# Patient Record
Sex: Female | Born: 1950 | Race: White | Hispanic: No | Marital: Married | Smoking: Former smoker
Health system: Southern US, Community
[De-identification: ages and names within clinical notes are randomized; demographics above are authoritative.]

## PROBLEM LIST (undated history)

## (undated) DIAGNOSIS — E785 Hyperlipidemia, unspecified: Secondary | ICD-10-CM

## (undated) DIAGNOSIS — H269 Unspecified cataract: Secondary | ICD-10-CM

## (undated) DIAGNOSIS — C801 Malignant (primary) neoplasm, unspecified: Secondary | ICD-10-CM

## (undated) DIAGNOSIS — M8580 Other specified disorders of bone density and structure, unspecified site: Secondary | ICD-10-CM

## (undated) HISTORY — DX: Malignant (primary) neoplasm, unspecified: C80.1

## (undated) HISTORY — PX: EYE SURGERY: SHX253

## (undated) HISTORY — DX: Other specified disorders of bone density and structure, unspecified site: M85.80

## (undated) HISTORY — DX: Unspecified cataract: H26.9

## (undated) HISTORY — DX: Hyperlipidemia, unspecified: E78.5

---

## 1978-04-02 HISTORY — PX: APPENDECTOMY: SHX54

## 2002-04-02 LAB — HM COLONOSCOPY: HM Colonoscopy: NORMAL

## 2004-04-12 ENCOUNTER — Ambulatory Visit: Payer: Self-pay | Admitting: Internal Medicine

## 2004-12-14 ENCOUNTER — Ambulatory Visit: Payer: Self-pay | Admitting: Gastroenterology

## 2005-07-10 ENCOUNTER — Ambulatory Visit: Payer: Self-pay | Admitting: Internal Medicine

## 2006-08-22 ENCOUNTER — Ambulatory Visit: Payer: Self-pay | Admitting: Internal Medicine

## 2006-09-05 ENCOUNTER — Encounter: Payer: Self-pay | Admitting: Family Medicine

## 2007-06-26 ENCOUNTER — Ambulatory Visit: Payer: Self-pay | Admitting: Family Medicine

## 2007-06-26 DIAGNOSIS — E785 Hyperlipidemia, unspecified: Secondary | ICD-10-CM | POA: Insufficient documentation

## 2008-01-01 DIAGNOSIS — M8580 Other specified disorders of bone density and structure, unspecified site: Secondary | ICD-10-CM

## 2008-01-01 HISTORY — DX: Other specified disorders of bone density and structure, unspecified site: M85.80

## 2008-01-01 LAB — HM DEXA SCAN

## 2008-01-05 ENCOUNTER — Other Ambulatory Visit: Admission: RE | Admit: 2008-01-05 | Discharge: 2008-01-05 | Payer: Self-pay | Admitting: Family Medicine

## 2008-01-05 ENCOUNTER — Encounter: Payer: Self-pay | Admitting: Family Medicine

## 2008-01-05 ENCOUNTER — Ambulatory Visit: Payer: Self-pay | Admitting: Family Medicine

## 2008-01-07 ENCOUNTER — Encounter: Payer: Self-pay | Admitting: Family Medicine

## 2008-01-09 ENCOUNTER — Encounter: Payer: Self-pay | Admitting: Family Medicine

## 2008-01-15 ENCOUNTER — Encounter: Payer: Self-pay | Admitting: Family Medicine

## 2008-01-15 ENCOUNTER — Ambulatory Visit: Payer: Self-pay | Admitting: Family Medicine

## 2008-01-19 DIAGNOSIS — M8580 Other specified disorders of bone density and structure, unspecified site: Secondary | ICD-10-CM | POA: Insufficient documentation

## 2008-01-20 ENCOUNTER — Telehealth: Payer: Self-pay | Admitting: Family Medicine

## 2008-08-10 ENCOUNTER — Telehealth: Payer: Self-pay | Admitting: Family Medicine

## 2009-05-03 ENCOUNTER — Telehealth: Payer: Self-pay | Admitting: Family Medicine

## 2009-05-12 ENCOUNTER — Ambulatory Visit: Payer: Self-pay | Admitting: Family Medicine

## 2009-05-12 ENCOUNTER — Encounter: Payer: Self-pay | Admitting: Family Medicine

## 2009-05-18 ENCOUNTER — Encounter: Payer: Self-pay | Admitting: Family Medicine

## 2009-05-23 ENCOUNTER — Ambulatory Visit: Payer: Self-pay | Admitting: Family Medicine

## 2009-05-23 ENCOUNTER — Encounter: Payer: Self-pay | Admitting: Family Medicine

## 2009-05-26 ENCOUNTER — Encounter (INDEPENDENT_AMBULATORY_CARE_PROVIDER_SITE_OTHER): Payer: Self-pay | Admitting: *Deleted

## 2009-05-26 ENCOUNTER — Telehealth: Payer: Self-pay | Admitting: Family Medicine

## 2009-05-26 LAB — HM MAMMOGRAPHY: HM Mammogram: NORMAL

## 2009-05-27 ENCOUNTER — Encounter: Payer: Self-pay | Admitting: Family Medicine

## 2009-06-16 ENCOUNTER — Encounter: Payer: Self-pay | Admitting: Family Medicine

## 2009-06-22 ENCOUNTER — Telehealth: Payer: Self-pay | Admitting: Family Medicine

## 2009-06-29 ENCOUNTER — Ambulatory Visit: Payer: Self-pay | Admitting: Family Medicine

## 2009-08-25 ENCOUNTER — Telehealth: Payer: Self-pay | Admitting: Family Medicine

## 2009-09-07 ENCOUNTER — Telehealth: Payer: Self-pay | Admitting: Family Medicine

## 2009-11-21 ENCOUNTER — Encounter: Payer: Self-pay | Admitting: Family Medicine

## 2009-11-21 ENCOUNTER — Ambulatory Visit: Payer: Self-pay | Admitting: Family Medicine

## 2009-11-23 ENCOUNTER — Encounter (INDEPENDENT_AMBULATORY_CARE_PROVIDER_SITE_OTHER): Payer: Self-pay | Admitting: *Deleted

## 2009-11-25 ENCOUNTER — Encounter: Payer: Self-pay | Admitting: Family Medicine

## 2010-05-02 NOTE — Progress Notes (Signed)
Summary: schedule f/ u mammogram  Phone Note Call from Patient Call back at [phone]   Caller: Patient Summary of Call: Advised pt of f/u mammogram results.  She would like for [place] to go ahead and schedule her 6 month follow up in september. Please dont schedule on a Monday. Initial call taken by: Lowella Petties CMA,  May 26, 2009 12:30 PM  Follow-up for Phone Call        will do order and route to [facility] Follow-up by: Judith Part MD,  May 26, 2009 1:44 PM  Additional Follow-up for Phone Call Additional follow up Details #1::        6 month FU MmG appt made for 08//22/2011 at [facility] Order faxed and mailed to the patient. Additional Follow-up by: Carlton Adam,  May 30, 2009 11:17 AM

## 2010-05-02 NOTE — Progress Notes (Signed)
Summary: needs another order for labs  Phone Note Call from Patient   Caller: Patient Call For: Judith Part MD Summary of Call: Pt had order for labs, which she took to [facility], but they told her she was a hard stick and they couldnt get her blood.  She is asking for another order that she can take to another lab.  She didnt ask them for her order back.  Please mail to her home address when ready.  Order was for lipids, alt, ast, vitamin d level. Initial call taken by: Lowella Petties CMA,  September 07, 2009 4:16 PM  Follow-up for Phone Call        order done on px in IN box Follow-up by: Judith Part MD,  September 07, 2009 5:25 PM  Additional Follow-up for Phone Call Additional follow up Details #1::        Patient notified as instructed by telephone. Lab request mailed to home address as requested.Lewanda Rife LPN  September 07, 6293 5:36 PM

## 2010-05-02 NOTE — Miscellaneous (Signed)
Summary: mammogram results  Clinical Lists Changes  Observations: Added new observation of MAMMO DUE: 12/2009 (05/26/2009 12:31) Added new observation of MAMMOGRAM: normal (05/26/2009 12:31)      Preventive Care Screening  Mammogram:    Date:  05/26/2009    Next Due:  12/2009    Results:  normal

## 2010-05-02 NOTE — Letter (Signed)
Summary: Results Follow up Letter  [place] [facility]  [address] [place], [place] [address]   Phone: [phone]  Fax: [phone]    11/23/2009 MRN: [id number]  [facility] [address] [place], [place]  [address]  Dear Ms. Moeller,  The following are the results of your recent test(s):  Test         Result    Pap Smear:        Normal _____  Not Normal _____ Comments: ______________________________________________________ Cholesterol: LDL(Bad cholesterol):         Your goal is less than:         HDL (Good cholesterol):       Your goal is more than: Comments:  ______________________________________________________ Mammogram:        Normal __X___  Not Normal _____ Comments:Dr. Tower recommends getting back on track with your regular mammogram for BOTH breasts in February.  ___________________________________________________________________ Hemoccult:        Normal _____  Not normal _______ Comments:    _____________________________________________________________________ Other Tests:    We routinely do not discuss normal results over the telephone.  If you desire a copy of the results, or you have any questions about this information we can discuss them at your next office visit.   Sincerely,      Janee Morn, CMA(AAMA)for Dr. Roxy Manns

## 2010-05-02 NOTE — Progress Notes (Signed)
Summary: wants order for blood work  Phone Note Call from Patient Call back at [phone]   Caller: Patient Summary of Call: Patient has an appointment on March 30th and would like an order to get her labs done prior to app. She would like order faxed to [phone]). She would also like to set up app. for mammorgram prior to app. Please advise.  Initial call taken by: Melody Comas,  May 03, 2009 3:05 PM  Follow-up for Phone Call        please sched labs wellness/ lipid/vit D 272, v70.0, 733.0 will do mam ref and route to [place] as well Follow-up by: Judith Part MD,  May 03, 2009 4:51 PM  Additional Follow-up for Phone Call Additional follow up Details #1::        Lab orders faxed to [phone]. Additional Follow-up by: Linde Gillis CMA Duncan Dull),  May 03, 2009 5:00 PM    Additional Follow-up for Phone Call Additional follow up Details #2::    MmG order put in for [facility] on 05/12/2009 at 3:45pm. Carlton Adam  May 05, 2009 3:49 PM  Follow-up by: Carlton Adam,  May 05, 2009 3:49 PM

## 2010-05-02 NOTE — Progress Notes (Signed)
Summary: [facility] request add'l test  Phone Note From Other Clinic   Caller: [employer] For: Dr Milinda Antis Summary of Call: [facility] is drawing lipid,ast,alt on pt now and pt request Vitamin D level. Dr Milinda Antis said pt had Vit D level in 05/2009 and it was within normal limits so she does not need done at this time, but if pt is adoment about getting OK to add Vit D level 268.9.Lewanda Rife LPN  Aug 25, 2009 9:22 AM  Initial call taken by: Lewanda Rife LPN,  Aug 25, 2009 9:22 AM

## 2010-05-02 NOTE — Assessment & Plan Note (Signed)
Summary: CPX/CLE   Vital Signs:  Patient profile:   60 year old female Height:      59.75 inches Weight:      134.75 pounds BMI:     26.63 Temp:     98.4 degrees F oral Pulse rate:   60 / minute Pulse rhythm:   regular BP sitting:   120 / 68  (left arm) Cuff size:   regular  Vitals Entered By: Lewanda Rife LPN (June 29, 2009 2:51 PM) CC: complete physical with pap and breast exam LMP 2 yrs ago   History of Present Illness: here for health mt exam  is feeling good   wt is down 4 lb with bmi of 26-- happy with that   likes to walk for exercise    lipids high  trig 95/ HDL 70 and LDL 168  (from 841)  simvastatin - stopped it for a while just to see what would happen  diet -- is excellent and really worked on it  got px yesterday to re start    osteopenia dexa 10/09 D level is ok at 45- was taking vit D 5000 international units per day- this works for her   pap 10/09 no gyn symptoms -- no bleeding  minor hot flashes - they have calmed   2/11 nl mam -- recommended 6 mo to check density  self exam - no lumps or changes   colonosc 04 normal - no problems or family hx   Td 09   will be interested in shingles vaccine when she is 61      Allergies (verified): No Known Drug Allergies  Past History:  Past Medical History: Last updated: 01/19/2008 hyperlipidema  family hx of OP osteopenia 10/09  Past Surgical History: Last updated: 01/19/2008 appy in 1980 after rupture  2D echo stress test 10/09 dexa with osteopenia   Family History: Last updated: 01/05/2008 father high chol, htn mother healthy - OP  Social History: Last updated: 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 Eyes:  Denies blurring and eye irritation. CV:  Denies chest pain or discomfort, lightheadness, palpitations, and shortness of breath with  exertion. Resp:  Denies cough, shortness of breath, and wheezing. GI:  Denies abdominal pain, bloody stools, change in bowel habits, indigestion, and nausea. GU:  Denies abnormal vaginal bleeding, discharge, dysuria, and urinary frequency. MS:  Denies joint redness, loss of strength, muscle aches, and cramps. Derm:  Denies itching, lesion(s), poor wound healing, and rash. Neuro:  Denies numbness and tingling. Psych:  Denies anxiety and depression. Endo:  Denies cold intolerance, excessive thirst, excessive urination, and heat intolerance. Heme:  Denies abnormal bruising and bleeding.  Physical Exam  General:  Well-developed,well-nourished,in no acute distress; alert,appropriate and cooperative throughout examination Head:  normocephalic, atraumatic, and no abnormalities observed.   Eyes:  vision grossly intact, pupils equal, pupils round, and pupils reactive to light.  no conjunctival pallor, injection or icterus  Ears:  R ear normal and L ear normal.   Nose:  no nasal discharge.   Mouth:  pharynx pink and moist.   Neck:  supple with full rom and no masses or thyromegally, no JVD or carotid bruit  Chest Wall:  No deformities, masses, or tenderness noted. Breasts:  No mass, nodules, thickening, tenderness, bulging, retraction, inflamation, nipple discharge or skin changes noted.  Lungs:  Normal respiratory effort, chest expands symmetrically. Lungs are clear to auscultation, no crackles or wheezes. Heart:  Normal rate and regular rhythm. S1 and S2 normal without gallop, murmur, click, rub or other extra sounds. Abdomen:  Bowel sounds positive,abdomen soft and non-tender without masses, organomegaly or hernias noted. no renal bruits  Msk:  No deformity or scoliosis noted of thoracic or lumbar spine.  no acute joint changes  Pulses:  R and L carotid,radial,femoral,dorsalis pedis and posterior tibial pulses are full and equal bilaterally Extremities:  No clubbing, cyanosis, edema, or  deformity noted with normal full range of motion of all joints.   Neurologic:  sensation intact to light touch, gait normal, and DTRs symmetrical and normal.   Skin:  Intact without suspicious lesions or rashes some lentigos  Cervical Nodes:  No lymphadenopathy noted Axillary Nodes:  No palpable lymphadenopathy Inguinal Nodes:  No significant adenopathy Psych:  normal affect, talkative and pleasant    Impression & Recommendations:  Problem # 1:  HEALTH MAINTENANCE EXAM (ICD-V70.0) Assessment Comment Only reviewed health habits including diet, exercise and skin cancer prevention reviewed health maintenance list and family history rev labs in detail today nl except for high cholesterol   Problem # 2:  OSTEOPENIA (ICD-733.90) due for 2 y dexa in the fall-- will plan at next PE rev ca and vit D  good exercise   Problem # 3:  HYPERLIPIDEMIA (ICD-272.4) Assessment: Deteriorated chol up off simvastatin (even with excellent habits)  will get back on it and re check lab in 6 weeks  Her updated medication list for this problem includes:    Simvastatin 40 Mg Tabs (Simvastatin) .Marland Kitchen... Take one by mouth daily  Complete Medication List: 1)  Simvastatin 40 Mg Tabs (Simvastatin) .... Take one by mouth daily 2)  Fish Oil 1200 Mg Caps (Omega-3 fatty acids) .... Take one by mouth daily 3)  Adult Aspirin Low Strength 81 Mg Tbdp (Aspirin) .... Take 1 by mouth daily 4)  Calcium With Vit D 800 Mg  .... Take by mouth daily as directed 5)  Vitamin D  .... 5000 total international units daily  Patient Instructions: 1)  you need labs in 6 weeks for lipids   2)  keep up the good work with diet and exercise  3)  continue calcium and vitamin D  4)  get back on simvastatin  5)  will plan bone density at next physical  6)  get mammogram in august  Prescriptions: SIMVASTATIN 40 MG  TABS (SIMVASTATIN) take one by mouth daily  #90 x 3   Entered and Authorized by:   Judith Part MD   Signed by:   Judith Part MD on 06/29/2009   Method used:   Print then Give to Patient   RxID:   [id number]   Current Allergies (reviewed today): No known allergies

## 2010-05-02 NOTE — Progress Notes (Signed)
Summary: simvastatin   Phone Note Call from Patient   Caller: Patient Call For: Judith Part MD Summary of Call: Patient has an appt on the 30th and she says that her lab work that was done at [facility] shows that her Cholesterol is high. She wants to know if she can get a rx for simvastatin 40 mg. She is asking that it be faxed to [phone] because she needs to do it online through [facility]. Initial call taken by: Melody Comas,  June 22, 2009 10:36 AM  Follow-up for Phone Call        has she been taking the med? - if she has we may need to inc dose because it is still high- let me know, thanks Follow-up by: Judith Part MD,  June 22, 2009 1:11 PM  Additional Follow-up for Phone Call Additional follow up Details #1::        Left message on home and cell or work phone for patient to call back. Lewanda Rife LPN  June 22, 2009 4:32 PM     Additional Follow-up for Phone Call Additional follow up Details #2::    Spoke with pt and she stopped Simvastatin in November of 2010. Pt had been trying to exercise and diet to get cholesterol down. Pt said she now realizes that is not going to work and she does not need the Simvastatin rx now because she contacted her pharmacy and they still had refills. Thank you.Lewanda Rife LPN  June 23, 2009 11:47 AM

## 2010-05-02 NOTE — Letter (Signed)
Summary: Appt Scheduled/[facility]  Appt Scheduled/ [facility]   Imported By: Lanelle Bal 05/21/2009 11:38:38  _____________________________________________________________________  External Attachment:    Type:   Image     Comment:   External Document

## 2010-06-21 ENCOUNTER — Telehealth: Payer: Self-pay | Admitting: *Deleted

## 2010-06-21 NOTE — Telephone Encounter (Signed)
Noted - was a mistake

## 2010-06-21 NOTE — Telephone Encounter (Signed)
Opened in error

## 2010-08-16 ENCOUNTER — Telehealth: Payer: Self-pay | Admitting: *Deleted

## 2010-08-16 DIAGNOSIS — Z1231 Encounter for screening mammogram for malignant neoplasm of breast: Secondary | ICD-10-CM | POA: Insufficient documentation

## 2010-08-16 DIAGNOSIS — Z0000 Encounter for general adult medical examination without abnormal findings: Secondary | ICD-10-CM | POA: Insufficient documentation

## 2010-08-16 NOTE — Telephone Encounter (Signed)
Order written on px pad for labs to fax Will do order for mammo -- please forward it to [facility]

## 2010-08-16 NOTE — Telephone Encounter (Signed)
Patient notified as instructed by telephone.  Pt said to fax now to [phone]. I faxed lab order to [phone] as instructed and pt will wait to hear from [facility].

## 2010-08-16 NOTE — Telephone Encounter (Signed)
Pt is coming in for a physical the first of June and she is asking that an order for labs prior be faxed to her at her work fax machine, number [phone], she said this is her personal fax machine.  Also, she is asking for a referral for a mammogram, goes to [place].

## 2010-08-19 ENCOUNTER — Encounter: Payer: Self-pay | Admitting: Family Medicine

## 2010-08-23 ENCOUNTER — Other Ambulatory Visit: Payer: Self-pay | Admitting: Family Medicine

## 2010-08-23 DIAGNOSIS — Z1231 Encounter for screening mammogram for malignant neoplasm of breast: Secondary | ICD-10-CM

## 2010-08-23 DIAGNOSIS — R928 Other abnormal and inconclusive findings on diagnostic imaging of breast: Secondary | ICD-10-CM | POA: Insufficient documentation

## 2010-08-24 NOTE — Telephone Encounter (Signed)
MMG order changed from screening to Bilateral Diagnostic MMG on 09/04/2010. Order faxed to [facility]. MK

## 2010-09-01 ENCOUNTER — Encounter: Payer: Self-pay | Admitting: Family Medicine

## 2010-09-04 ENCOUNTER — Ambulatory Visit: Payer: Self-pay | Admitting: Family Medicine

## 2010-09-08 ENCOUNTER — Encounter: Payer: Self-pay | Admitting: Family Medicine

## 2010-10-18 ENCOUNTER — Other Ambulatory Visit: Payer: Self-pay | Admitting: *Deleted

## 2010-10-18 MED ORDER — SIMVASTATIN 40 MG PO TABS: 40.0000 mg | ORAL_TABLET | Freq: Every day | ORAL | Status: DC

## 2010-11-27 ENCOUNTER — Ambulatory Visit (INDEPENDENT_AMBULATORY_CARE_PROVIDER_SITE_OTHER): Payer: Self-pay | Admitting: Family Medicine

## 2010-11-27 ENCOUNTER — Encounter: Payer: Self-pay | Admitting: Family Medicine

## 2010-11-27 DIAGNOSIS — Z0000 Encounter for general adult medical examination without abnormal findings: Secondary | ICD-10-CM

## 2010-11-27 DIAGNOSIS — E785 Hyperlipidemia, unspecified: Secondary | ICD-10-CM

## 2010-11-27 DIAGNOSIS — Z01419 Encounter for gynecological examination (general) (routine) without abnormal findings: Secondary | ICD-10-CM

## 2010-11-27 DIAGNOSIS — M899 Disorder of bone, unspecified: Secondary | ICD-10-CM

## 2010-11-27 DIAGNOSIS — M949 Disorder of cartilage, unspecified: Secondary | ICD-10-CM

## 2010-11-27 NOTE — Assessment & Plan Note (Signed)
Exam today  Pap sent to [facility]  No problems Handling hot flashes well

## 2010-11-27 NOTE — Patient Instructions (Addendum)
We will set up bone density test at check out  Pap today  Continue your calcium and vitamin D and exercise  Labs including cholesterol look good

## 2010-11-27 NOTE — Assessment & Plan Note (Signed)
Lipids well controlled with simvastatin  Tolerating this dose well - will continue to monitor Rev lab with pt  Rev low sat fat diet

## 2010-11-27 NOTE — Progress Notes (Signed)
Subjective:    Patient ID: Kristin Neal, female    DOB: 1950/06/06, 60 y.o.   MRN: [id number]  HPI Here for annual health mt exam and to review chronic health problems   Is doing well  Had a good summer - went to the beach Still working   Is feeling good   Pap- ? Last one -- was ? 09  Normal No problems Goes to [facility]   Mam 6/12 normal - often has to get repeats due to fibrocystic change  Went to [employer]  Self exam no lumps or changes   Td 10/09  dexa - osteopenia 10/09 Is due for for that again  Ca and D D level is 36- is also outdoors every day   Lipids on zocor trig 78 and HDL 80 and LDL 102- very good  Diet - is good and healthy  Wt is up 3 lbs  For exercise - jumps rope and has stationary bike and a horse - lots of walking too   Patient Active Problem List  Diagnoses  . HYPERLIPIDEMIA  . OSTEOPENIA  . Other screening mammogram  . Routine general medical examination at a health care facility  . Abnormal mammogram  . Gynecological examination   Past Medical History  Diagnosis Date  . Hyperlipidemia   . Osteopenia 01/2008   Past Surgical History  Procedure Date  . Appendectomy 1980    after rupture   History  Substance Use Topics  . Smoking status: Former Smoker    Quit date: 04/02/1976  . Smokeless tobacco: Not on file  . Alcohol Use: Yes     occasional   Family History  Problem Relation Age of Onset  . Osteoporosis Mother   . Hyperlipidemia Father   . Hypertension Father    No Known Allergies Current Outpatient Prescriptions on File Prior to Visit  Medication Sig Dispense Refill  . aspirin 81 MG tablet Take 81 mg by mouth daily.        Marland Kitchen CALCIUM-VITAMIN D PO Take 1 tablet by mouth as directed.       . simvastatin (ZOCOR) 40 MG tablet Take 1 tablet (40 mg total) by mouth at bedtime.  30 tablet  2  . Cholecalciferol (VITAMIN D3) 5000 UNITS TABS Take 1 tablet by mouth daily.        . Omega-3 Fatty Acids (FISH OIL) 1200 MG CAPS Take 1 capsule  by mouth daily.             Review of Systems Review of Systems  Constitutional: Negative for fever, appetite change, fatigue and unexpected weight change.  Eyes: Negative for pain and visual disturbance.  Respiratory: Negative for cough and shortness of breath.   Cardiovascular: Negative.  for cp or palpitations Gastrointestinal: Negative for nausea, diarrhea and constipation.  Genitourinary: Negative for urgency and frequency. or vaginal d/c Skin: Negative for pallor. or rash  Neurological: Negative for weakness, light-headedness, numbness and headaches.  Hematological: Negative for adenopathy. Does not bruise/bleed easily.  Psychiatric/Behavioral: Negative for dysphoric mood. The patient is not nervous/anxious.          Objective:   Physical Exam  Constitutional: She appears well-developed and well-nourished. No distress.  HENT:  Head: Normocephalic and atraumatic.  Right Ear: External ear normal.  Left Ear: External ear normal.  Nose: Nose normal.  Mouth/Throat: Oropharynx is clear and moist.  Eyes: Conjunctivae and EOM are normal. Pupils are equal, round, and reactive to light.  Neck: Normal range of motion. Neck  supple. No JVD present. Carotid bruit is not present. No thyromegaly present.  Cardiovascular: Normal rate, regular rhythm, normal heart sounds and intact distal pulses.   Pulmonary/Chest: Effort normal and breath sounds normal. No respiratory distress. She has no wheezes.  Abdominal: Soft. Bowel sounds are normal. She exhibits no distension and no mass. There is no tenderness.  Genitourinary: Vagina normal and uterus normal. No breast swelling, tenderness, discharge or bleeding. No vaginal discharge found.  Musculoskeletal: Normal range of motion. She exhibits no edema and no tenderness.  Lymphadenopathy:    She has no cervical adenopathy.  Neurological: She is alert. She has normal reflexes. No cranial nerve deficit. Coordination normal.  Skin: Skin is warm and  dry. No rash noted. No erythema. No pallor.       lentigos noted   Psychiatric: She has a normal mood and affect.          Assessment & Plan:

## 2010-11-27 NOTE — Assessment & Plan Note (Signed)
Due for 2 y dexa On ca and D D level in 30s  Disc safety sched dexa

## 2010-11-27 NOTE — Assessment & Plan Note (Signed)
Reviewed health habits including diet and exercise and skin cancer prevention Also reviewed health mt list, fam hx and immunizations  Rev wellness labs in detail 

## 2010-12-01 ENCOUNTER — Telehealth: Payer: Self-pay | Admitting: *Deleted

## 2010-12-01 ENCOUNTER — Encounter: Payer: Self-pay | Admitting: Family Medicine

## 2010-12-01 MED ORDER — SIMVASTATIN 40 MG PO TABS: 40.0000 mg | ORAL_TABLET | Freq: Every day | ORAL | Status: DC

## 2010-12-01 NOTE — Telephone Encounter (Signed)
Px printed for pick up in IN box  

## 2010-12-01 NOTE — Telephone Encounter (Signed)
Patient request a Rx for Simvastatin 90 day supply be mailed to her home address so she can send it to mail order pharmacy.  Please advise.

## 2010-12-05 ENCOUNTER — Ambulatory Visit: Payer: Self-pay | Admitting: Family Medicine

## 2010-12-05 NOTE — Telephone Encounter (Signed)
Rx mailed to patient's home address.

## 2010-12-06 ENCOUNTER — Encounter: Payer: Self-pay | Admitting: Family Medicine

## 2010-12-12 ENCOUNTER — Encounter: Payer: Self-pay | Admitting: *Deleted

## 2011-01-30 ENCOUNTER — Telehealth: Payer: Self-pay | Admitting: Family Medicine

## 2011-01-30 NOTE — Telephone Encounter (Signed)
Pt called the office regarding a bill for a bone density preformed at [facility] in Sept.  Pt says the ins stated claim was processed and denied because of the diagnosis code (screeming code). Told pt I would review the claim and call her back.   I spoke with a representative for the Billing Dept at [facility]. She stated the claim was processed correctly, says $278.00 was applied to the patients yearly deductiable and they took their adjustment per the ins co. I called Kristin Neal, left message...cdavis 01-30-2011

## 2011-05-09 IMAGING — MG MM CAD SCREENING MAMMO
1 series · 4 of 4 positions shown · non-contrast
Comparison: none

REASON FOR EXAM: SCR
COMMENTS:

[R CC · right · 4 of 4 slices shown]
[im 1/4]
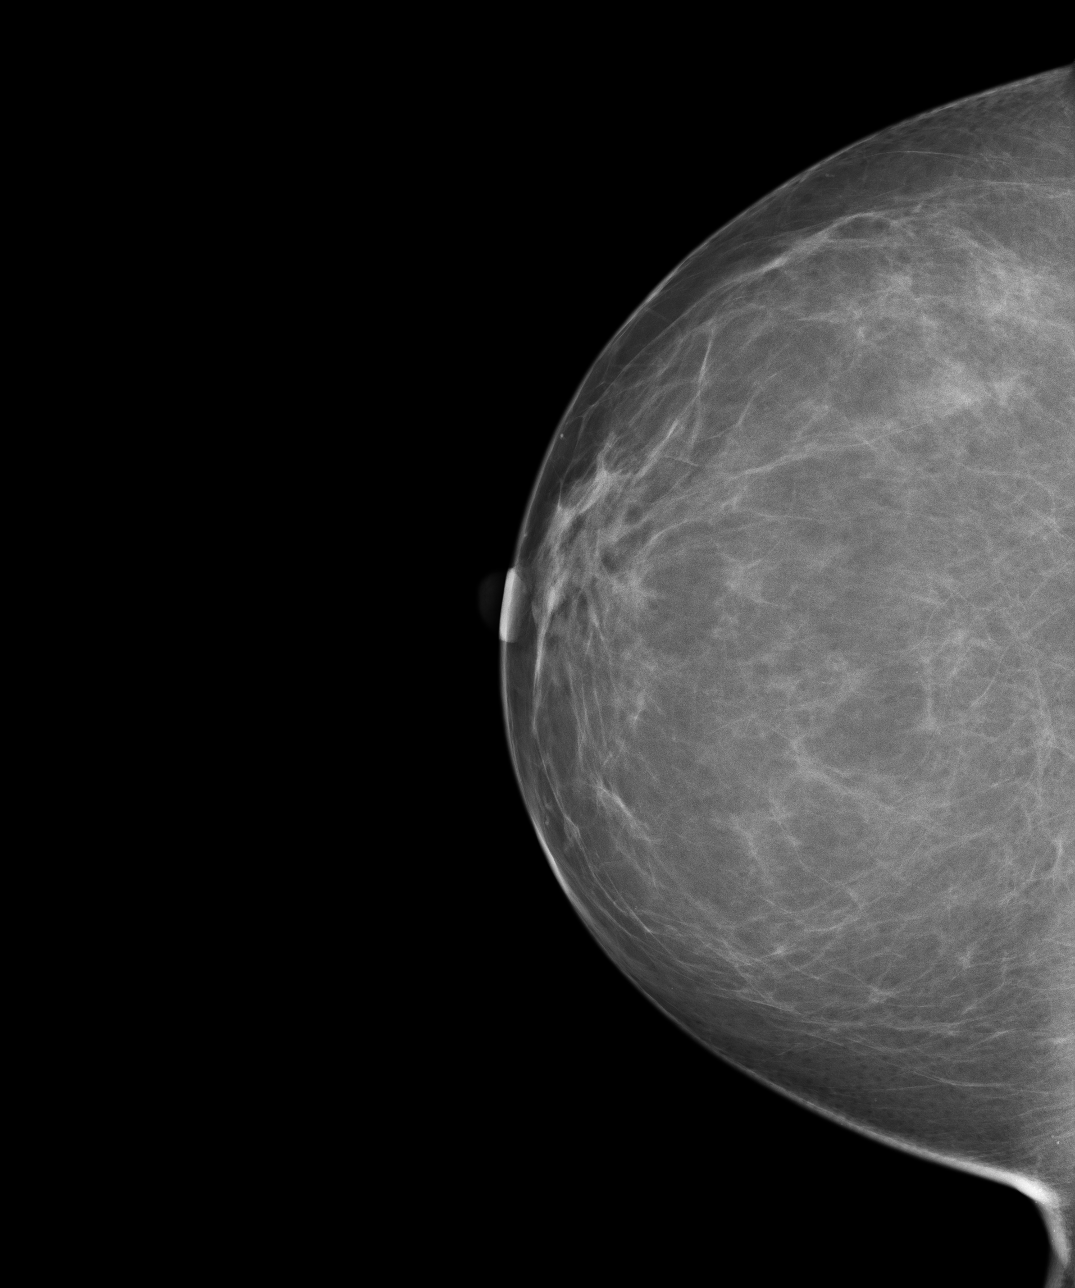
[im 2/4]
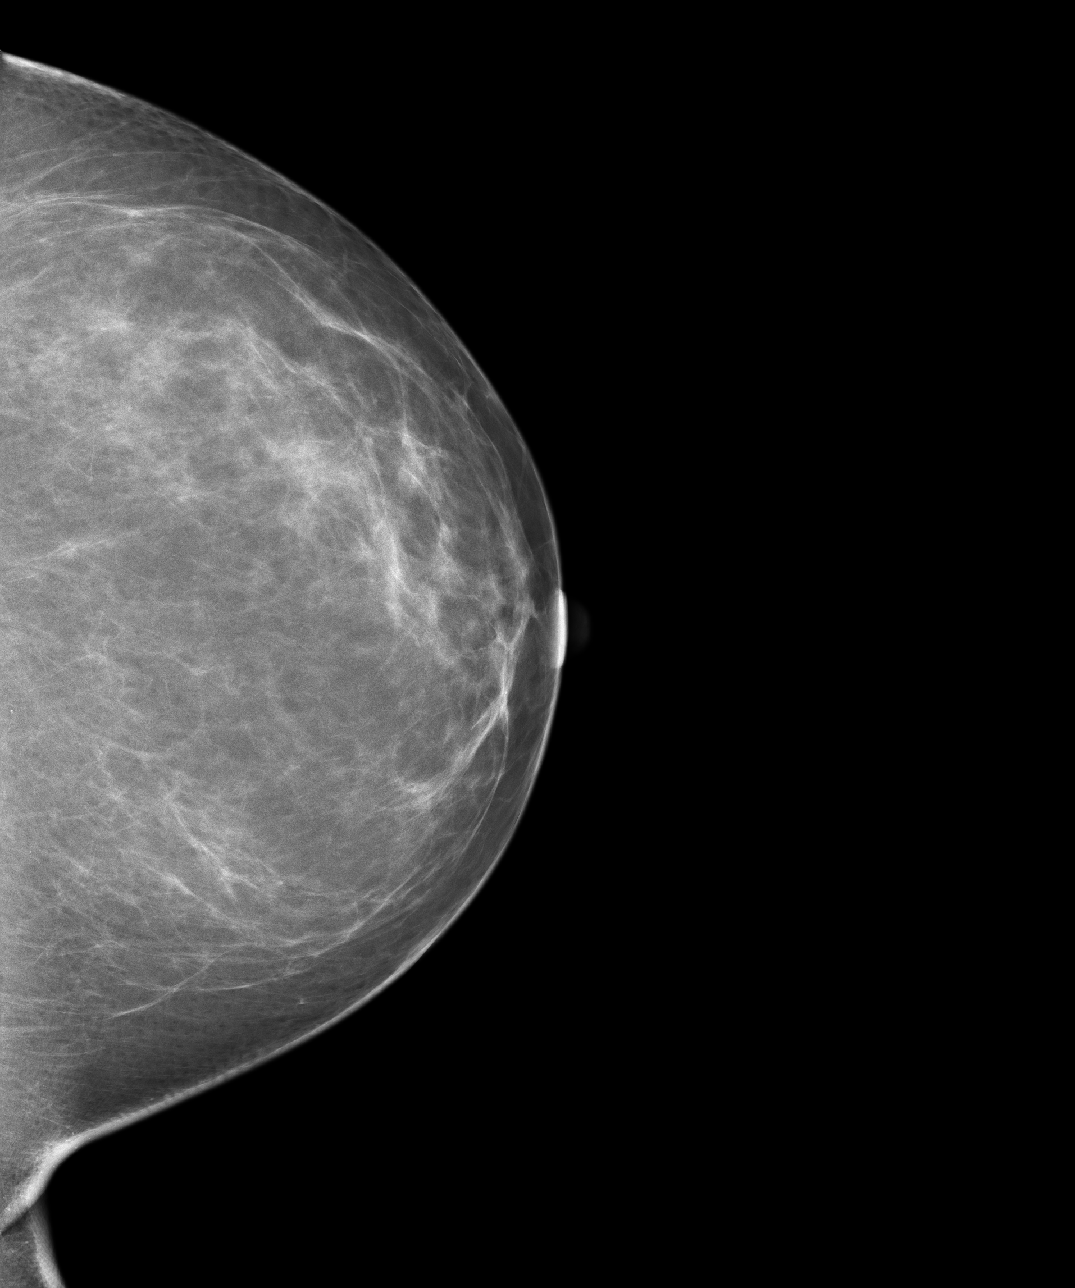
[im 3/4]
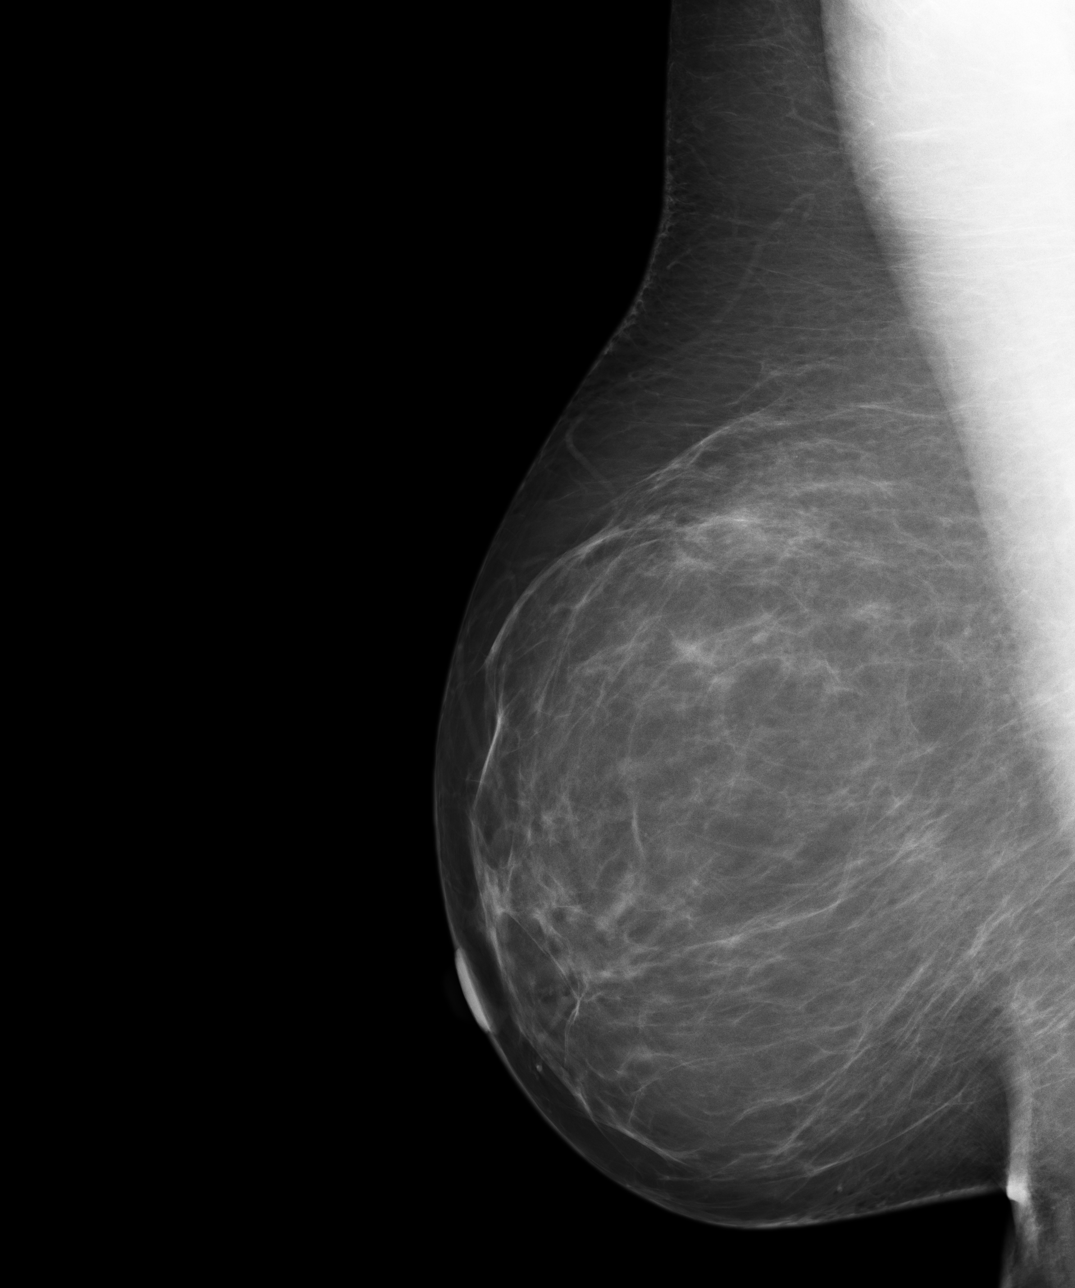
[im 4/4]
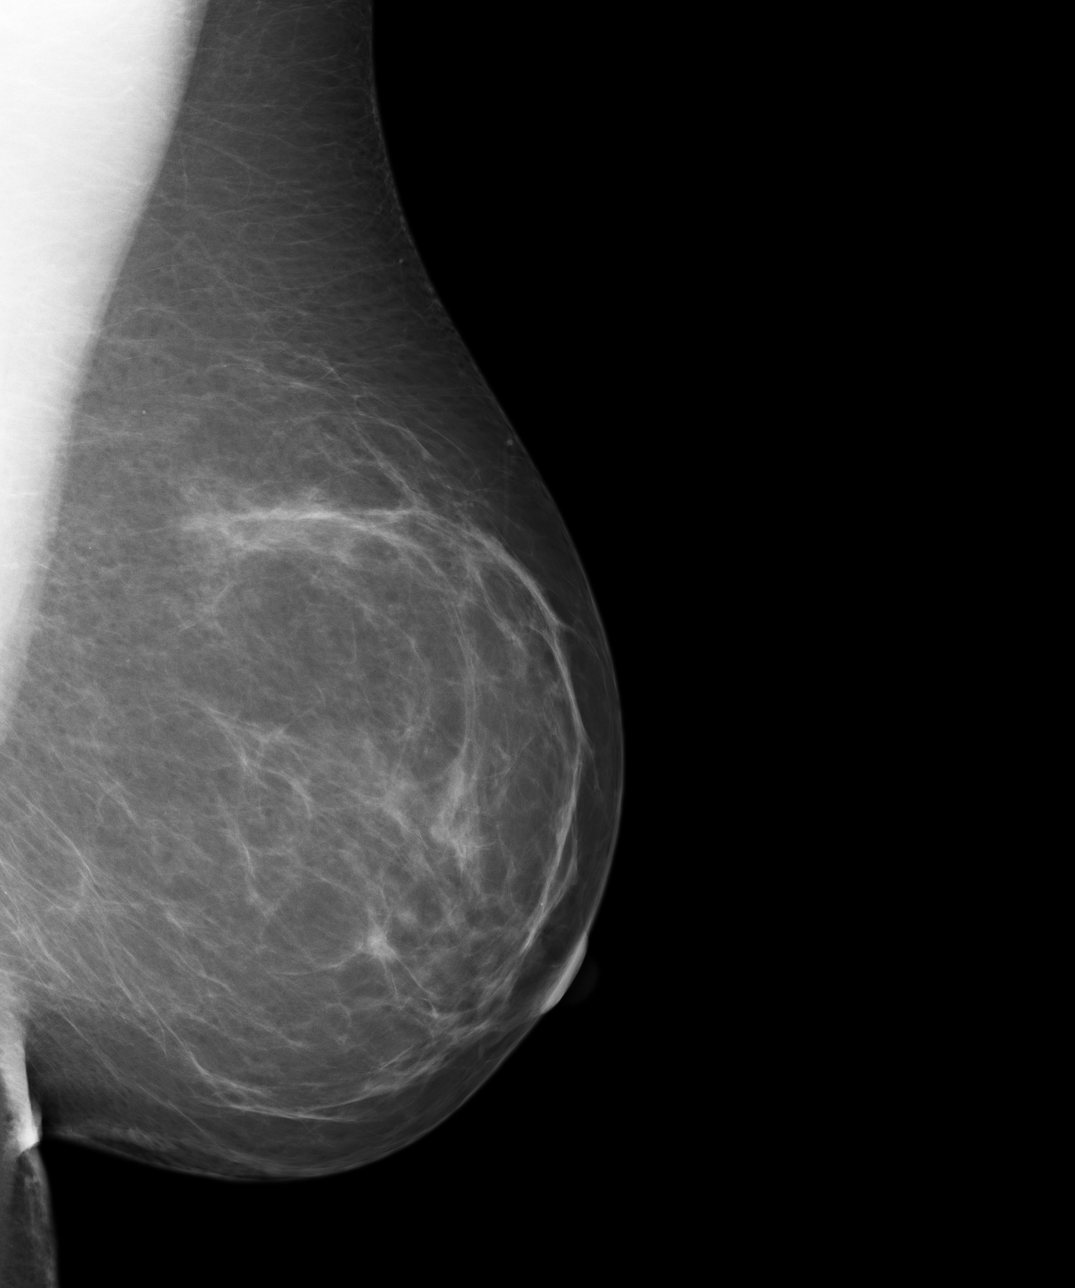

[4 of 4 positions shown; findings below may reference images not displayed]

PROCEDURE:     MAM - MAM DGTL SCREENING MAMMO W/CAD  - May 12, 2009  [DATE]

RESULT:       Comparison is made to a study of 01/15/08, 08/22/06 and
04/12/04.  A film screen study dated 11/16/99 is reviewed as well.

The breasts exhibit a moderately dense parenchymal pattern.  There is no
dominant mass.  There are no malignant-appearing groupings of
microcalcification.  No area of new architectural distortion is seen.  There
is density noted inferomedially on the left which is slightly more
conspicuous than in the past.
IMPRESSION: 1.      I do not see findings highly suspicious for malignancy. I feel it
would be prudent for the patient to undergo spot compression views and a
true lateral film of a focus of increased density located inferomedially on
the left.  The area in question has been marked electronically and the
images saved.  If the findings persist on the supplemental views, ultrasound
at the same visit would be useful as well.
2.     BI-RADS:  Category 0- Needs Additional Imaging.
3.     Additional workup of the left breast is recommended.

A negative mammogram report does not preclude biopsy or other evaluation of
a clinically palpable or otherwise suspicious mass or lesion. Breast cancer
may not be detected by mammography in up to 10% of cases.

## 2012-08-01 ENCOUNTER — Telehealth: Payer: Self-pay | Admitting: Family Medicine

## 2012-08-01 DIAGNOSIS — Z0000 Encounter for general adult medical examination without abnormal findings: Secondary | ICD-10-CM

## 2012-08-01 DIAGNOSIS — E785 Hyperlipidemia, unspecified: Secondary | ICD-10-CM

## 2012-08-01 NOTE — Telephone Encounter (Signed)
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  She would prefer the order emailed to [email or web address], but if you can't email them then she would appreciate if you could fax them to [phone].  If neither of options are available then it is ok to mail them to her home address.  Her CPE is scheduled for 12/10/2012 at 2:30 p.m. Thank you.

## 2012-08-01 NOTE — Telephone Encounter (Signed)
Called pt an pt wanted me to mail the order to her, orders mailed per pt request

## 2012-08-01 NOTE — Telephone Encounter (Signed)
I ordered this electronically and Terri will give you the order to fax

## 2012-09-05 ENCOUNTER — Ambulatory Visit: Payer: 59 | Admitting: Internal Medicine

## 2012-12-06 LAB — COMPREHENSIVE METABOLIC PANEL
AST: 27 IU/L (ref 0–40)
Albumin: 4.6 g/dL (ref 3.6–4.8)
Alkaline Phosphatase: 66 IU/L (ref 39–117)
BUN/Creatinine Ratio: 24 (ref 11–26)
BUN: 20 mg/dL (ref 8–27)
Chloride: 101 mmol/L (ref 97–108)
Creatinine, Ser: 0.82 mg/dL (ref 0.57–1.00)
GFR calc Af Amer: 90 mL/min/{1.73_m2} (ref >59)
Globulin, Total: 2.2 g/dL (ref 1.5–4.5)
Sodium: 140 mmol/L (ref 134–144)
Total Bilirubin: 0.5 mg/dL (ref 0.0–1.2)

## 2012-12-06 LAB — LIPID PANEL
LDL Calculated: 184 mg/dL — ABNORMAL HIGH (ref 0–99)
VLDL Cholesterol Cal: 18 mg/dL (ref 5–40)

## 2012-12-06 LAB — CBC WITH DIFFERENTIAL/PLATELET
Basophils Absolute: 0 10*3/uL (ref 0.0–0.2)
HCT: 38.7 % (ref 34.0–46.6)
Hemoglobin: 13 g/dL (ref 11.1–15.9)
Lymphocytes Absolute: 2.1 10*3/uL (ref 0.7–3.1)
MCH: 31 pg (ref 26.6–33.0)
MCHC: 33.6 g/dL (ref 31.5–35.7)
MCV: 92 fL (ref 79–97)
Monocytes Absolute: 0.6 10*3/uL (ref 0.1–0.9)
Monocytes: 8 % (ref 4–12)
Neutrophils Absolute: 3.3 10*3/uL (ref 1.4–7.0)
RDW: 13.1 % (ref 12.3–15.4)

## 2012-12-10 ENCOUNTER — Ambulatory Visit (INDEPENDENT_AMBULATORY_CARE_PROVIDER_SITE_OTHER): Payer: 59 | Admitting: Family Medicine

## 2012-12-10 ENCOUNTER — Encounter: Payer: Self-pay | Admitting: Family Medicine

## 2012-12-10 VITALS — BP 134/74 | HR 62 | Temp 98.7°F | Ht 59.75 in | Wt 140.2 lb

## 2012-12-10 DIAGNOSIS — E785 Hyperlipidemia, unspecified: Secondary | ICD-10-CM

## 2012-12-10 DIAGNOSIS — M899 Disorder of bone, unspecified: Secondary | ICD-10-CM

## 2012-12-10 DIAGNOSIS — Z0000 Encounter for general adult medical examination without abnormal findings: Secondary | ICD-10-CM

## 2012-12-10 MED ORDER — SIMVASTATIN 40 MG PO TABS: 40.0000 mg | ORAL_TABLET | Freq: Every day | ORAL | Status: DC

## 2012-12-10 NOTE — Patient Instructions (Addendum)
Start back on simvastatin  Schedule fasting labs for 6-8 weeks Try to get 1200-1500 mg of calcium per day with at least 1000 iu of vitamin D - for bone health  If you are interested in a shingles/zoster vaccine - call your insurance to check on coverage,( you should not get it within 1 month of other vaccines) , then call us for a prescription  for it to take to a pharmacy that gives the shot , or make a nurse visit to get it here depending on your coverage Keep taking good care of yourself

## 2012-12-10 NOTE — Progress Notes (Signed)
Subjective:    Patient ID: Kristin Neal, female    DOB: [birth year], 62 y.o.   MRN: [id number]  HPI Here for health maintenance exam and to review chronic medical problems    Wt is up 3 lb with bmi of 27  Zoster status - would be interested in a shingles vaccine  She did have chicken pox as a child   colonosc 1/04 nl   Mammogram 6/12 ok - due for one at [facility] - and she will schedule that  Self exam-no breast lumps  Pap 8/12 nl No gyn problem- no bleeding or discharge , no abn paps , no new partners   Flu vaccine- she declines flu vaccines , personal choice    Td 10/09  Openia dexa 9/12- and she does not want to get one yet  No falls or broken bones She exercises  Does not take ca or D  Hyperlipidemia She stopped her simvastatin Thought it affected her memory  No results found for this basename: CHOL   Lab Results  Component Value Date   HDL 79 12/05/2012   Lab Results  Component Value Date   LDLCALC 184* 12/05/2012   Lab Results  Component Value Date   TRIG 88 12/05/2012   Lab Results  Component Value Date   CHOLHDL 3.6 12/05/2012   No results found for this basename: LDLDIRECT   diet is great - and her cholesterol stays high anyway  This is likely hereditary She is open to trying simvastatin again    Mood - has a good attitude and good mood  Patient Active Problem List   Diagnosis Date Noted  . Gynecological examination 11/27/2010  . Other screening mammogram 08/16/2010  . Routine general medical examination at a health care facility 08/16/2010  . OSTEOPENIA 01/19/2008  . HYPERLIPIDEMIA 06/26/2007   Past Medical History  Diagnosis Date  . Hyperlipidemia   . Osteopenia 01/2008   Past Surgical History  Procedure Laterality Date  . Appendectomy  1980    after rupture   History  Substance Use Topics  . Smoking status: Former Smoker    Quit date: 04/02/1976  . Smokeless tobacco: Not on file  . Alcohol Use: Yes     Comment: occasional   Family  History  Problem Relation Age of Onset  . Osteoporosis Mother   . Hyperlipidemia Father   . Hypertension Father    No Known Allergies No current outpatient prescriptions on file prior to visit.   No current facility-administered medications on file prior to visit.    Review of Systems Review of Systems  Constitutional: Negative for fever, appetite change, fatigue and unexpected weight change.  Eyes: Negative for pain and visual disturbance.  Respiratory: Negative for cough and shortness of breath.   Cardiovascular: Negative for cp or palpitations    Gastrointestinal: Negative for nausea, diarrhea and constipation.  Genitourinary: Negative for urgency and frequency.  Skin: Negative for pallor or rash   Neurological: Negative for weakness, light-headedness, numbness and headaches.  Hematological: Negative for adenopathy. Does not bruise/bleed easily.  Psychiatric/Behavioral: Negative for dysphoric mood. The patient is not nervous/anxious.         Objective:   Physical Exam  Constitutional: She appears well-developed and well-nourished. No distress.  HENT:  Head: Normocephalic and atraumatic.  Right Ear: External ear normal.  Left Ear: External ear normal.  Nose: Nose normal.  Mouth/Throat: Oropharynx is clear and moist.  Eyes: Conjunctivae and EOM are normal. Pupils are equal, round, and reactive  to light. Right eye exhibits no discharge. Left eye exhibits no discharge. No scleral icterus.  Neck: Normal range of motion. Neck supple. No JVD present. Carotid bruit is not present. No thyromegaly present.  Cardiovascular: Normal rate, regular rhythm, normal heart sounds and intact distal pulses.  Exam reveals no gallop.   Pulmonary/Chest: Breath sounds normal. No respiratory distress. She has no wheezes. She has no rales.  Abdominal: Soft. Bowel sounds are normal. She exhibits no distension, no abdominal bruit and no mass. There is no tenderness.  Genitourinary: No breast swelling,  tenderness, discharge or bleeding.  Breast exam: No mass, nodules, thickening, tenderness, bulging, retraction, inflamation, nipple discharge or skin changes noted.  No axillary or clavicular LA.  Chaperoned exam.    Musculoskeletal: She exhibits no edema and no tenderness.  Lymphadenopathy:    She has no cervical adenopathy.  Neurological: She is alert. She has normal reflexes. No cranial nerve deficit. She exhibits normal muscle tone. Coordination normal.  Skin: Skin is warm and dry. No rash noted. No erythema. No pallor.  Psychiatric: She has a normal mood and affect.          Assessment & Plan:

## 2012-12-11 NOTE — Assessment & Plan Note (Signed)
Disc goals for lipids and reasons to control them Rev labs with pt Rev low sat fat diet in detail Up off statin Disc pros and cons and pt will re start simvastatin Lab in 6-8 wk

## 2012-12-11 NOTE — Assessment & Plan Note (Signed)
Rev last dexa  Pt does not want to schedule her 2 y f/u yet No falls or fx Disc safety and also exercise/ ca/ D

## 2012-12-11 NOTE — Assessment & Plan Note (Signed)
Reviewed health habits including diet and exercise and skin cancer prevention Also reviewed health mt list, fam hx and immunizations  Wellness labs reviewed Declines flu shot

## 2012-12-24 ENCOUNTER — Ambulatory Visit: Payer: 59

## 2012-12-25 ENCOUNTER — Ambulatory Visit (INDEPENDENT_AMBULATORY_CARE_PROVIDER_SITE_OTHER): Payer: 59 | Admitting: *Deleted

## 2012-12-25 DIAGNOSIS — Z2911 Encounter for prophylactic immunotherapy for respiratory syncytial virus (RSV): Secondary | ICD-10-CM

## 2012-12-25 DIAGNOSIS — Z23 Encounter for immunization: Secondary | ICD-10-CM

## 2013-02-09 ENCOUNTER — Encounter: Payer: Self-pay | Admitting: Family Medicine

## 2013-02-09 ENCOUNTER — Ambulatory Visit: Payer: Self-pay | Admitting: Family Medicine

## 2014-01-07 ENCOUNTER — Other Ambulatory Visit: Payer: Self-pay | Admitting: Family Medicine

## 2014-01-07 NOTE — Telephone Encounter (Signed)
Please schedule PE when able and refill until then  

## 2014-01-07 NOTE — Telephone Encounter (Signed)
Electronic refill request, no recent/future appt., please advise  

## 2014-01-08 NOTE — Telephone Encounter (Signed)
Left voicemail requesting pt to call office back 

## 2014-01-13 ENCOUNTER — Telehealth: Payer: Self-pay | Admitting: *Deleted

## 2014-01-13 NOTE — Telephone Encounter (Signed)
That is fine - put her in for first avail new pt appt -it may be a bit of a wait

## 2014-01-13 NOTE — Telephone Encounter (Signed)
Pt has a daughter that is about to turn 65 and she needs to establish care with a new PCP (goes to peds doc), pt wants to know if you would see her daughter, please advise

## 2014-01-13 NOTE — Telephone Encounter (Signed)
Left voicemail letting pt know it's okay to schedule her daughter a new pt appt with Dr. Glori Bickers

## 2014-01-13 NOTE — Telephone Encounter (Signed)
appt scheduled and med refilled 

## 2014-04-20 ENCOUNTER — Encounter: Payer: Self-pay | Admitting: Family Medicine

## 2014-04-20 ENCOUNTER — Ambulatory Visit (INDEPENDENT_AMBULATORY_CARE_PROVIDER_SITE_OTHER): Payer: 59 | Admitting: Family Medicine

## 2014-04-20 ENCOUNTER — Other Ambulatory Visit (HOSPITAL_COMMUNITY)
Admission: RE | Admit: 2014-04-20 | Discharge: 2014-04-20 | Disposition: A | Discharge status: Home / Self Care | Payer: 59 | Source: Ambulatory Visit | Attending: Family Medicine | Admitting: Family Medicine

## 2014-04-20 VITALS — BP 132/84 | HR 53 | Temp 98.2°F | Ht 60.0 in | Wt 140.1 lb

## 2014-04-20 DIAGNOSIS — Z1151 Encounter for screening for human papillomavirus (HPV): Secondary | ICD-10-CM | POA: Diagnosis present

## 2014-04-20 DIAGNOSIS — M8580 Other specified disorders of bone density and structure, unspecified site: Secondary | ICD-10-CM

## 2014-04-20 DIAGNOSIS — Z01419 Encounter for gynecological examination (general) (routine) without abnormal findings: Secondary | ICD-10-CM

## 2014-04-20 DIAGNOSIS — Z1211 Encounter for screening for malignant neoplasm of colon: Secondary | ICD-10-CM

## 2014-04-20 DIAGNOSIS — Z0000 Encounter for general adult medical examination without abnormal findings: Secondary | ICD-10-CM

## 2014-04-20 DIAGNOSIS — E785 Hyperlipidemia, unspecified: Secondary | ICD-10-CM

## 2014-04-20 MED ORDER — SIMVASTATIN 40 MG PO TABS: 40.0000 mg | ORAL_TABLET | Freq: Every day | ORAL | Status: DC

## 2014-04-20 NOTE — Progress Notes (Signed)
Pre visit review using our clinic review tool, if applicable. No additional management support is needed unless otherwise documented below in the visit note. 

## 2014-04-20 NOTE — Progress Notes (Signed)
Subjective:    Patient ID: Kristin Neal, female    DOB: 01-04-1951, 64 y.o.   MRN: [id number]  HPI Here for health maintenance exam and to review chronic medical problems    Wt is stable with bmi of 27   Declines screening for HIV- not high risk  colonosc 1/04- she is due for a 10 year recall / not ready to do it yet-will do a stool card   Pap 8/12- is due for gyn exam today  No gyn problems  Does not see a gyn    Mammogram 11/14 nl - is due for a mammogram  She goes to [facility] -will make her own appt  Self exam -no lumps   No change in family history  Flu vaccine - declines   Td 10/09 Zoster vaccine 9/14   Brought some labs from [place] - wellness screen from July  A1C was 5.9 with glucose of 98  Tot chol 191, trig 129, HDL 67 and LDL 98   Still wants regular labs - need to order through [facility] and release   Patient Active Problem List   Diagnosis Date Noted  . Gynecological examination 11/27/2010  . Other screening mammogram 08/16/2010  . Routine general medical examination at a health care facility 08/16/2010  . Osteopenia 01/19/2008  . Hyperlipidemia 06/26/2007   Past Medical History  Diagnosis Date  . Hyperlipidemia   . Osteopenia 01/2008   Past Surgical History  Procedure Laterality Date  . Appendectomy  1980    after rupture   History  Substance Use Topics  . Smoking status: Former Smoker    Quit date: 04/02/1976  . Smokeless tobacco: Not on file  . Alcohol Use: Yes     Comment: occasional   Family History  Problem Relation Age of Onset  . Osteoporosis Mother   . Hyperlipidemia Father   . Hypertension Father    No Known Allergies Current Outpatient Prescriptions on File Prior to Visit  Medication Sig Dispense Refill  . simvastatin (ZOCOR) 40 MG tablet Take 1 tablet by mouth at  bedtime 90 tablet 1   No current facility-administered medications on file prior to visit.     She started using cinnamon for health    Review of  Systems Review of Systems  Constitutional: Negative for fever, appetite change, fatigue and unexpected weight change.  Eyes: Negative for pain and visual disturbance.  Respiratory: Negative for cough and shortness of breath.   Cardiovascular: Negative for cp or palpitations    Gastrointestinal: Negative for nausea, diarrhea and constipation.  Genitourinary: Negative for urgency and frequency.  Skin: Negative for pallor or rash   Neurological: Negative for weakness, light-headedness, numbness and headaches.  Hematological: Negative for adenopathy. Does not bruise/bleed easily.  Psychiatric/Behavioral: Negative for dysphoric mood. The patient is not nervous/anxious.         Objective:   Physical Exam  Constitutional: She appears well-developed and well-nourished. No distress.  HENT:  Head: Normocephalic and atraumatic.  Right Ear: External ear normal.  Left Ear: External ear normal.  Mouth/Throat: Oropharynx is clear and moist.  Eyes: Conjunctivae and EOM are normal. Pupils are equal, round, and reactive to light. No scleral icterus.  Neck: Normal range of motion. Neck supple. No JVD present. Carotid bruit is not present. No thyromegaly present.  Cardiovascular: Normal rate, regular rhythm, normal heart sounds and intact distal pulses.  Exam reveals no gallop.   Pulmonary/Chest: Effort normal and breath sounds normal. No respiratory distress. She  has no wheezes. She exhibits no tenderness.  Abdominal: Soft. Bowel sounds are normal. She exhibits no distension, no abdominal bruit and no mass. There is no tenderness.  Genitourinary: Vagina normal and uterus normal. No breast swelling, tenderness, discharge or bleeding. There is no rash, tenderness or lesion on the right labia. There is no rash, tenderness or lesion on the left labia. Uterus is not tender. Cervix exhibits no motion tenderness, no discharge and no friability. Right adnexum displays no mass, no tenderness and no fullness. Left  adnexum displays no mass, no tenderness and no fullness. No bleeding in the vagina. No vaginal discharge found.  Breast exam: No mass, nodules, thickening, tenderness, bulging, retraction, inflamation, nipple discharge or skin changes noted.  No axillary or clavicular LA.      Musculoskeletal: Normal range of motion. She exhibits no edema or tenderness.  Lymphadenopathy:    She has no cervical adenopathy.  Neurological: She is alert. She has normal reflexes. No cranial nerve deficit. She exhibits normal muscle tone. Coordination normal.  Skin: Skin is warm and dry. No rash noted. No erythema. No pallor.  Psychiatric: She has a normal mood and affect.          Assessment & Plan:   Problem List Items Addressed This Visit      Musculoskeletal and Integument   Osteopenia    Ref for dexa  No falls or fx Disc need for calcium/ vitamin D/ wt bearing exercise and bone density test every 2 y to monitor Disc safety/ fracture risk in detail        Relevant Orders   DG Bone Density     Other   Colon cancer screening    Declines f/u colonoscopy yet - wants to do IFOB at this time        Relevant Orders   Fecal occult blood, imunochemical   Encounter for routine gynecological examination    Exam today with pap  No complaints        Relevant Orders   Cytology - PAP (Completed)   Hyperlipidemia    Rev screening from [facility]- and ordered a full panel  Rev goals for lipid and low sat fat diet reviewed        Relevant Medications   simvastatin (ZOCOR) tablet   Routine general medical examination at a health care facility - Primary    Reviewed health habits including diet and exercise and skin cancer prevention Reviewed appropriate screening tests for age  Also reviewed health mt list, fam hx and immunization status , as well as social and family history    Lab order given for [facility] IFOB card given  dexa ordered       Relevant Orders   CBC with Differential    Comprehensive metabolic panel   TSH   Hemoglobin A1c   Lipid panel

## 2014-04-20 NOTE — Patient Instructions (Signed)
Please do stool card for colon cancer screening  Here are orders for [facility] Try to work on healthy lifestyle habits  Stop at check out for referral for bone density test

## 2014-04-21 LAB — CYTOLOGY - PAP

## 2014-04-23 NOTE — Assessment & Plan Note (Signed)
Ref for dexa  No falls or fx Disc need for calcium/ vitamin D/ wt bearing exercise and bone density test every 2 y to monitor Disc safety/ fracture risk in detail

## 2014-04-23 NOTE — Assessment & Plan Note (Signed)
Rev screening from [facility]- and ordered a full panel  Rev goals for lipid and low sat fat diet reviewed

## 2014-04-23 NOTE — Assessment & Plan Note (Signed)
Reviewed health habits including diet and exercise and skin cancer prevention Reviewed appropriate screening tests for age  Also reviewed health mt list, fam hx and immunization status , as well as social and family history    Lab order given for [facility] IFOB card given  dexa ordered

## 2014-04-23 NOTE — Assessment & Plan Note (Signed)
Declines f/u colonoscopy yet - wants to do IFOB at this time

## 2014-04-23 NOTE — Assessment & Plan Note (Signed)
Exam today with pap  No complaints

## 2014-04-26 ENCOUNTER — Encounter: Payer: Self-pay | Admitting: Family Medicine

## 2014-05-02 LAB — FECAL OCCULT BLOOD, IMMUNOCHEMICAL: FECAL OCCULT BLD: NEGATIVE

## 2014-05-08 LAB — COMPREHENSIVE METABOLIC PANEL
ALK PHOS: 56 IU/L (ref 39–117)
ALT: 22 IU/L (ref 0–32)
AST: 24 IU/L (ref 0–40)
Albumin/Globulin Ratio: 1.8 (ref 1.1–2.5)
Albumin: 4.5 g/dL (ref 3.6–4.8)
BUN/Creatinine Ratio: 18 (ref 11–26)
BUN: 14 mg/dL (ref 8–27)
CALCIUM: 9.5 mg/dL (ref 8.7–10.3)
CO2: 23 mmol/L (ref 18–29)
Chloride: 100 mmol/L (ref 97–108)
Creatinine, Ser: 0.79 mg/dL (ref 0.57–1.00)
GFR calc Af Amer: 93 mL/min/{1.73_m2} (ref >59)
GFR, EST NON AFRICAN AMERICAN: 80 mL/min/{1.73_m2} (ref >59)
GLUCOSE: 89 mg/dL (ref 65–99)
Globulin, Total: 2.5 g/dL (ref 1.5–4.5)
Potassium: 5 mmol/L (ref 3.5–5.2)
SODIUM: 140 mmol/L (ref 134–144)
Total Bilirubin: 0.3 mg/dL (ref 0.0–1.2)
Total Protein: 7 g/dL (ref 6.0–8.5)

## 2014-05-08 LAB — CBC WITH DIFFERENTIAL
Basophils Absolute: 0 10*3/uL (ref 0.0–0.2)
Basos: 1 %
EOS: 9 %
Eosinophils Absolute: 0.5 10*3/uL — ABNORMAL HIGH (ref 0.0–0.4)
HEMATOCRIT: 37.2 % (ref 34.0–46.6)
HEMOGLOBIN: 12.5 g/dL (ref 11.1–15.9)
Immature Grans (Abs): 0 10*3/uL (ref 0.0–0.1)
Immature Granulocytes: 0 %
Lymphocytes Absolute: 1.8 10*3/uL (ref 0.7–3.1)
Lymphs: 29 %
MCH: 31 pg (ref 26.6–33.0)
MCHC: 33.6 g/dL (ref 31.5–35.7)
MCV: 92 fL (ref 79–97)
MONOCYTES: 8 %
Monocytes Absolute: 0.5 10*3/uL (ref 0.1–0.9)
NEUTROS ABS: 3.3 10*3/uL (ref 1.4–7.0)
NEUTROS PCT: 53 %
RBC: 4.03 x10E6/uL (ref 3.77–5.28)
RDW: 12.6 % (ref 12.3–15.4)
WBC: 6.1 10*3/uL (ref 3.4–10.8)

## 2014-05-08 LAB — LIPID PANEL
CHOL/HDL RATIO: 2.7 ratio (ref 0.0–4.4)
Cholesterol, Total: 197 mg/dL (ref 100–199)
HDL: 72 mg/dL (ref >39)
LDL Calculated: 106 mg/dL — ABNORMAL HIGH (ref 0–99)
TRIGLYCERIDES: 97 mg/dL (ref 0–149)
VLDL Cholesterol Cal: 19 mg/dL (ref 5–40)

## 2014-05-08 LAB — TSH: TSH: 0.921 u[IU]/mL (ref 0.450–4.500)

## 2014-05-08 LAB — HEMOGLOBIN A1C
Est. average glucose Bld gHb Est-mCnc: 120 mg/dL
Hgb A1c MFr Bld: 5.8 % — ABNORMAL HIGH (ref 4.8–5.6)

## 2014-05-24 ENCOUNTER — Encounter: Payer: Self-pay | Admitting: Family Medicine

## 2014-05-24 ENCOUNTER — Ambulatory Visit: Payer: Self-pay | Admitting: Family Medicine

## 2015-06-16 ENCOUNTER — Other Ambulatory Visit: Payer: Self-pay | Admitting: Family Medicine

## 2015-06-16 NOTE — Telephone Encounter (Signed)
Please schedule a PE and refill until then  

## 2015-06-16 NOTE — Telephone Encounter (Signed)
Electronic refill request, no recent/future appt., last CPE was over a year ago, please advise

## 2015-06-17 NOTE — Telephone Encounter (Signed)
Left voicemail requesting pt to call office back and schedule appt

## 2015-06-21 NOTE — Telephone Encounter (Signed)
Pt scheduled CPE and med refilled  Dr. Glori Bickers pt is a [employer] employee and requested you to put in the lab orders for [facility] and she will go a week before her appt to have them done

## 2015-08-18 ENCOUNTER — Other Ambulatory Visit: Payer: Self-pay | Admitting: Family Medicine

## 2015-08-18 ENCOUNTER — Telehealth: Payer: Self-pay | Admitting: Family Medicine

## 2015-08-18 DIAGNOSIS — Z0000 Encounter for general adult medical examination without abnormal findings: Secondary | ICD-10-CM

## 2015-08-18 DIAGNOSIS — R739 Hyperglycemia, unspecified: Secondary | ICD-10-CM

## 2015-08-18 DIAGNOSIS — R7303 Prediabetes: Secondary | ICD-10-CM | POA: Insufficient documentation

## 2015-08-18 NOTE — Telephone Encounter (Signed)
I will do the orders for [facility] Please have the lab release them so they can also be faxed Thanks

## 2015-08-18 NOTE — Telephone Encounter (Signed)
Patient is coming in for a physical on Monday.  Patient works for [employer] and would like a requisition faxed to her for her appointment at the lab at 8 am tomorrow. Fax number is [phone].

## 2015-08-18 NOTE — Telephone Encounter (Signed)
Terri at the lab said she will release them and I left voicemail letting pt know labs orders are done and she can go to any [facility]

## 2015-08-20 LAB — COMPREHENSIVE METABOLIC PANEL
ALK PHOS: 71 IU/L (ref 39–117)
ALT: 21 IU/L (ref 0–32)
AST: 26 IU/L (ref 0–40)
Albumin/Globulin Ratio: 1.8 (ref 1.2–2.2)
Albumin: 4.7 g/dL (ref 3.6–4.8)
BILIRUBIN TOTAL: 0.5 mg/dL (ref 0.0–1.2)
BUN/Creatinine Ratio: 22 (ref 12–28)
BUN: 20 mg/dL (ref 8–27)
CHLORIDE: 99 mmol/L (ref 96–106)
CO2: 23 mmol/L (ref 18–29)
CREATININE: 0.89 mg/dL (ref 0.57–1.00)
Calcium: 9.8 mg/dL (ref 8.7–10.3)
GFR calc Af Amer: 80 mL/min/{1.73_m2} (ref >59)
GFR calc non Af Amer: 69 mL/min/{1.73_m2} (ref >59)
GLUCOSE: 93 mg/dL (ref 65–99)
Globulin, Total: 2.6 g/dL (ref 1.5–4.5)
Potassium: 5.1 mmol/L (ref 3.5–5.2)
Sodium: 137 mmol/L (ref 134–144)
TOTAL PROTEIN: 7.3 g/dL (ref 6.0–8.5)

## 2015-08-20 LAB — TSH: TSH: 0.669 u[IU]/mL (ref 0.450–4.500)

## 2015-08-20 LAB — LIPID PANEL
CHOL/HDL RATIO: 2.8 ratio (ref 0.0–4.4)
CHOLESTEROL TOTAL: 205 mg/dL — AB (ref 100–199)
HDL: 74 mg/dL (ref >39)
LDL CALC: 114 mg/dL — AB (ref 0–99)
TRIGLYCERIDES: 84 mg/dL (ref 0–149)
VLDL Cholesterol Cal: 17 mg/dL (ref 5–40)

## 2015-08-20 LAB — CBC WITH DIFFERENTIAL/PLATELET
BASOS ABS: 0 10*3/uL (ref 0.0–0.2)
Basos: 0 %
EOS (ABSOLUTE): 0.5 10*3/uL — ABNORMAL HIGH (ref 0.0–0.4)
EOS: 9 %
HEMOGLOBIN: 13.4 g/dL (ref 11.1–15.9)
Hematocrit: 39.2 % (ref 34.0–46.6)
IMMATURE GRANS (ABS): 0 10*3/uL (ref 0.0–0.1)
IMMATURE GRANULOCYTES: 0 %
LYMPHS: 28 %
Lymphocytes Absolute: 1.5 10*3/uL (ref 0.7–3.1)
MCH: 31.5 pg (ref 26.6–33.0)
MCHC: 34.2 g/dL (ref 31.5–35.7)
MCV: 92 fL (ref 79–97)
MONOCYTES: 9 %
Monocytes Absolute: 0.5 10*3/uL (ref 0.1–0.9)
Neutrophils Absolute: 2.9 10*3/uL (ref 1.4–7.0)
Neutrophils: 54 %
Platelets: 251 10*3/uL (ref 150–379)
RBC: 4.25 x10E6/uL (ref 3.77–5.28)
RDW: 12.7 % (ref 12.3–15.4)
WBC: 5.4 10*3/uL (ref 3.4–10.8)

## 2015-08-20 LAB — HEMOGLOBIN A1C
ESTIMATED AVERAGE GLUCOSE: 120 mg/dL
Hgb A1c MFr Bld: 5.8 % — ABNORMAL HIGH (ref 4.8–5.6)

## 2015-08-22 ENCOUNTER — Encounter: Payer: Self-pay | Admitting: Family Medicine

## 2015-08-22 ENCOUNTER — Ambulatory Visit (INDEPENDENT_AMBULATORY_CARE_PROVIDER_SITE_OTHER): Payer: 59 | Admitting: Family Medicine

## 2015-08-22 VITALS — BP 125/70 | HR 63 | Temp 98.1°F | Ht 59.75 in | Wt 143.5 lb

## 2015-08-22 DIAGNOSIS — E785 Hyperlipidemia, unspecified: Secondary | ICD-10-CM

## 2015-08-22 DIAGNOSIS — M8580 Other specified disorders of bone density and structure, unspecified site: Secondary | ICD-10-CM

## 2015-08-22 DIAGNOSIS — Z0000 Encounter for general adult medical examination without abnormal findings: Secondary | ICD-10-CM

## 2015-08-22 DIAGNOSIS — R739 Hyperglycemia, unspecified: Secondary | ICD-10-CM

## 2015-08-22 DIAGNOSIS — E2839 Other primary ovarian failure: Secondary | ICD-10-CM | POA: Insufficient documentation

## 2015-08-22 DIAGNOSIS — Z1211 Encounter for screening for malignant neoplasm of colon: Secondary | ICD-10-CM

## 2015-08-22 MED ORDER — SIMVASTATIN 40 MG PO TABS: ORAL_TABLET | ORAL | Status: DC

## 2015-08-22 NOTE — Progress Notes (Signed)
Pre visit review using our clinic review tool, if applicable. No additional management support is needed unless otherwise documented below in the visit note. 

## 2015-08-22 NOTE — Assessment & Plan Note (Signed)
Disc goals for lipids and reasons to control them Rev labs with pt Rev low sat fat diet in detail Overall fairly stable with zocor and diet

## 2015-08-22 NOTE — Progress Notes (Signed)
Subjective:    Patient ID: Kristin Neal, female    DOB: Aug 26, 1950, 65 y.o.   MRN: [id number]  HPI Here for health maintenance exam and to review chronic medical problems    Feeling fine /no issues   Wt is up 3 lb with bmi of 28 Works out at [employer] 5 d a week and takes good care of herself Eats healthy for the most part   Hep C/HIV screen- not high risk/ declines   Colonoscopy 1/04 nl - she declines further colonoscopy ifob 1/16 nl  Wants to do another stool ifob card  Given info on cologuard-? If ins covers   Mm 11/14 nl - thinks she has had [facility] since [facility] Self breast exam- no lumps   Pap 1/16 nl with neg hpv test No problems or risk factors or symptoms   Td 10/09  Zoster vaccine 9/14  dexa 9/12 with osteopenia  Wants to do one  No falls  No fractures  Takes vit D3 but not taking calcium   Hx of hyperlipidemia Lab Results  Component Value Date   CHOL 205* 08/19/2015   CHOL 197 05/07/2014   CHOL 281* 12/05/2012   Lab Results  Component Value Date   HDL 74 08/19/2015   HDL 72 05/07/2014   HDL 79 12/05/2012   Lab Results  Component Value Date   LDLCALC 114* 08/19/2015   LDLCALC 106* 05/07/2014   LDLCALC 184* 12/05/2012   Lab Results  Component Value Date   TRIG 84 08/19/2015   TRIG 97 05/07/2014   TRIG 88 12/05/2012   Lab Results  Component Value Date   CHOLHDL 2.8 08/19/2015   CHOLHDL 2.7 05/07/2014   CHOLHDL 3.6 12/05/2012   No results found for: LDLDIRECT  Diet is good overall -surprised that her LDL went up  On zocor and diet   Hx of hyperglycemia Lab Results  Component Value Date   HGBA1C 5.8* 08/19/2015    BP Readings from Last 3 Encounters:  08/22/15 138/82  04/20/14 132/84  12/10/12 134/74    Patient Active Problem List   Diagnosis Date Noted  . Estrogen deficiency 08/22/2015  . Hyperglycemia 08/18/2015  . Colon cancer screening 04/20/2014  . Encounter for routine gynecological examination 11/27/2010  . Other  screening mammogram 08/16/2010  . Routine general medical examination at a health care facility 08/16/2010  . Osteopenia 01/19/2008  . Hyperlipidemia 06/26/2007   Past Medical History  Diagnosis Date  . Hyperlipidemia   . Osteopenia 01/2008   Past Surgical History  Procedure Laterality Date  . Appendectomy  1980    after rupture   Social History  Substance Use Topics  . Smoking status: Former Smoker    Quit date: 04/02/1976  . Smokeless tobacco: None  . Alcohol Use: 0.0 oz/week    0 Standard drinks or equivalent per week     Comment: occasional   Family History  Problem Relation Age of Onset  . Osteoporosis Mother   . Hyperlipidemia Father   . Hypertension Father    No Known Allergies No current outpatient prescriptions on file prior to visit.   No current facility-administered medications on file prior to visit.     Review of Systems    Review of Systems  Constitutional: Negative for fever, appetite change, fatigue and unexpected weight change.  Eyes: Negative for pain and visual disturbance.  Respiratory: Negative for cough and shortness of breath.   Cardiovascular: Negative for cp or palpitations  Gastrointestinal: Negative for nausea, diarrhea and constipation.  Genitourinary: Negative for urgency and frequency.  Skin: Negative for pallor or rash   Neurological: Negative for weakness, light-headedness, numbness and headaches.  Hematological: Negative for adenopathy. Does not bruise/bleed easily.  Psychiatric/Behavioral: Negative for dysphoric mood. The patient is not nervous/anxious.      Objective:   Physical Exam  Constitutional: She appears well-developed and well-nourished. No distress.  overwt and well appearing   HENT:  Head: Normocephalic and atraumatic.  Right Ear: External ear normal.  Left Ear: External ear normal.  Mouth/Throat: Oropharynx is clear and moist.  Eyes: Conjunctivae and EOM are normal. Pupils are equal, round, and reactive to  light. No scleral icterus.  Neck: Normal range of motion. Neck supple. No JVD present. Carotid bruit is not present. No thyromegaly present.  Cardiovascular: Normal rate, regular rhythm, normal heart sounds and intact distal pulses.  Exam reveals no gallop.   Pulmonary/Chest: Effort normal and breath sounds normal. No respiratory distress. She has no wheezes. She exhibits no tenderness.  Abdominal: Soft. Bowel sounds are normal. She exhibits no distension, no abdominal bruit and no mass. There is no tenderness.  Genitourinary: No breast swelling, tenderness, discharge or bleeding.  Breast exam: No mass, nodules, thickening, tenderness, bulging, retraction, inflamation, nipple discharge or skin changes noted.  No axillary or clavicular LA.      Musculoskeletal: Normal range of motion. She exhibits no edema or tenderness.  Lymphadenopathy:    She has no cervical adenopathy.  Neurological: She is alert. She has normal reflexes. No cranial nerve deficit. She exhibits normal muscle tone. Coordination normal.  Skin: Skin is warm and dry. No rash noted. No erythema. No pallor.  Solar lentigines noted  Psychiatric: She has a normal mood and affect.          Assessment & Plan:   Problem List Items Addressed This Visit      Musculoskeletal and Integument   Osteopenia    No falls or fractures  On vit D  Schedule f/u dexa (has been over 2y)  Disc need for calcium/ vitamin D/ wt bearing exercise and bone density test every 2 y to monitor Disc safety/ fracture risk in detail           Other   Routine general medical examination at a health care facility - Primary    Reviewed health habits including diet and exercise and skin cancer prevention Reviewed appropriate screening tests for age  Also reviewed health mt list, fam hx and immunization status , as well as social and family history   See HPI Labs reviewed  ifob given for colon cancer screening -pt will check about coverage for  cologuard in the future (declines colonoscopy this year) Try to get 1200-1500 mg of calcium per day with at least 1000 iu of vitamin D - for bone health  Declines HIV/Hep c screen-not high risk  Will call re; last mammogram ? If due        Hyperlipidemia    Disc goals for lipids and reasons to control them Rev labs with pt Rev low sat fat diet in detail Overall fairly stable with zocor and diet       Relevant Medications   simvastatin (ZOCOR) 40 MG tablet   Hyperglycemia    Lab Results  Component Value Date   HGBA1C 5.8* 08/19/2015   This is stable Enc low glycemic diet and wt control and exercise       Estrogen deficiency  Relevant Orders   DG Bone Density   Colon cancer screening    Declines colonoscopy this year  Given ifob  Also given info on cologuard to see if ins covers it for the future       Relevant Orders   Fecal occult blood, imunochemical

## 2015-08-22 NOTE — Assessment & Plan Note (Signed)
Reviewed health habits including diet and exercise and skin cancer prevention Reviewed appropriate screening tests for age  Also reviewed health mt list, fam hx and immunization status , as well as social and family history   See HPI Labs reviewed  ifob given for colon cancer screening -pt will check about coverage for cologuard in the future (declines colonoscopy this year) Try to get 1200-1500 mg of calcium per day with at least 1000 iu of vitamin D - for bone health  Declines HIV/Hep c screen-not high risk  Will call re; last mammogram ? If due

## 2015-08-22 NOTE — Assessment & Plan Note (Signed)
No falls or fractures  On vit D  Schedule f/u dexa (has been over 2y)  Disc need for calcium/ vitamin D/ wt bearing exercise and bone density test every 2 y to monitor Disc safety/ fracture risk in detail

## 2015-08-22 NOTE — Assessment & Plan Note (Signed)
Declines colonoscopy this year  Given ifob  Also given info on cologuard to see if ins covers it for the future

## 2015-08-22 NOTE — Assessment & Plan Note (Signed)
Lab Results  Component Value Date   HGBA1C 5.8* 08/19/2015   This is stable Enc low glycemic diet and wt control and exercise

## 2015-08-22 NOTE — Patient Instructions (Addendum)
Please call for last mammogram report from [place] (will do this at check out)  Do the stool kit for screening ( ask your insurance about cologuard for next year)  Try to get 1200-1500 mg of calcium per day with at least 1000 iu of vitamin D - for bone health  Stop at check out for referral for bone density test  Keep up the exercise    

## 2015-08-25 ENCOUNTER — Telehealth: Payer: Self-pay | Admitting: Family Medicine

## 2015-08-25 NOTE — Telephone Encounter (Signed)
Dr Glori Bickers Ms holts last bone density was 05/24/14.  Do you want her to have one again before the 2 year requirement?

## 2015-08-25 NOTE — Telephone Encounter (Signed)
No I do not. 

## 2015-10-25 ENCOUNTER — Other Ambulatory Visit: Payer: Self-pay | Admitting: Family Medicine

## 2015-10-25 DIAGNOSIS — Z1231 Encounter for screening mammogram for malignant neoplasm of breast: Secondary | ICD-10-CM

## 2015-11-08 ENCOUNTER — Ambulatory Visit
Admission: RE | Admit: 2015-11-08 | Discharge: 2015-11-08 | Disposition: A | Discharge status: Home / Self Care | Payer: 59 | Source: Ambulatory Visit | Attending: Family Medicine | Admitting: Family Medicine

## 2015-11-08 ENCOUNTER — Other Ambulatory Visit: Payer: Self-pay | Admitting: Family Medicine

## 2015-11-08 DIAGNOSIS — Z1231 Encounter for screening mammogram for malignant neoplasm of breast: Secondary | ICD-10-CM | POA: Diagnosis present

## 2016-08-21 ENCOUNTER — Other Ambulatory Visit: Payer: Self-pay | Admitting: Family Medicine

## 2016-08-22 NOTE — Telephone Encounter (Signed)
Left voicemail requesting pt to call the office and schedule f/u before we can refill med

## 2016-08-22 NOTE — Telephone Encounter (Signed)
Pt hasn't been seen in over a year and no future appts. or labs, please advise

## 2016-08-22 NOTE — Telephone Encounter (Signed)
Please schedule summer f/u and refill until then thanks

## 2016-09-18 ENCOUNTER — Telehealth: Payer: Self-pay | Admitting: Family Medicine

## 2016-09-18 DIAGNOSIS — R739 Hyperglycemia, unspecified: Secondary | ICD-10-CM

## 2016-09-18 DIAGNOSIS — Z0000 Encounter for general adult medical examination without abnormal findings: Secondary | ICD-10-CM

## 2016-09-18 NOTE — Telephone Encounter (Signed)
Patient has an appointment for a cpx on 10/30/16.  Patient works for [employer] and would like an order put in the computer, so she can have the labs done at [employer] before her appointment.

## 2016-09-19 NOTE — Telephone Encounter (Signed)
Per pt request lab orders faxed to pt's secure fax # at work

## 2016-09-19 NOTE — Telephone Encounter (Signed)
Orders done- printed req in IN box to give or mail to her thanks

## 2016-10-30 ENCOUNTER — Ambulatory Visit (INDEPENDENT_AMBULATORY_CARE_PROVIDER_SITE_OTHER): Payer: 59 | Admitting: Family Medicine

## 2016-10-30 ENCOUNTER — Other Ambulatory Visit: Payer: Self-pay | Admitting: Family Medicine

## 2016-10-30 ENCOUNTER — Encounter: Payer: Self-pay | Admitting: Family Medicine

## 2016-10-30 VITALS — BP 126/64 | HR 61 | Temp 98.6°F | Ht 59.5 in | Wt 144.2 lb

## 2016-10-30 DIAGNOSIS — M8580 Other specified disorders of bone density and structure, unspecified site: Secondary | ICD-10-CM | POA: Diagnosis not present

## 2016-10-30 DIAGNOSIS — R739 Hyperglycemia, unspecified: Secondary | ICD-10-CM | POA: Diagnosis not present

## 2016-10-30 DIAGNOSIS — Z0000 Encounter for general adult medical examination without abnormal findings: Secondary | ICD-10-CM

## 2016-10-30 DIAGNOSIS — E2839 Other primary ovarian failure: Secondary | ICD-10-CM

## 2016-10-30 DIAGNOSIS — E7800 Pure hypercholesterolemia, unspecified: Secondary | ICD-10-CM

## 2016-10-30 DIAGNOSIS — Z1211 Encounter for screening for malignant neoplasm of colon: Secondary | ICD-10-CM

## 2016-10-30 DIAGNOSIS — Z1231 Encounter for screening mammogram for malignant neoplasm of breast: Secondary | ICD-10-CM

## 2016-10-30 MED ORDER — SIMVASTATIN 40 MG PO TABS: ORAL_TABLET | ORAL | 3 refills | Status: DC

## 2016-10-30 NOTE — Progress Notes (Signed)
Subjective:    Patient ID: Kristin Neal, female    DOB: 1950/08/13, 66 y.o.   MRN: [id number]  HPI  Here for health maintenance exam and to review chronic medical problems    No complaints -feeling well  Taking care of herself  Eating well  Exercise - office moved so she had to find different walking plan  More stairs and found a new place   Wt Readings from Last 3 Encounters:  10/30/16 144 lb 4 oz (65.4 kg)  08/22/15 143 lb 8 oz (65.1 kg)  04/20/14 140 lb 1.9 oz (63.6 kg)  stable  28.65 kg/m   Will get labs today  [facility]eds to be [facility]   dexa 2/16- osteopenia (progressed) Takes ca and D  No falls Walks regularly  No fractures  Working on balance    Mammogram 8/17 nl -does not have set up yet  Self breast exam -no lumps or changes   Pap 1/16 neg with neg HPV  No gyn symptoms   Colonoscopy 1/04 neg  ifob neg 1/16 Will call to see if cologuard is covered  No symptoms    zostavax 9/14  Hyperlipidemia Lab Results  Component Value Date   CHOL 205 (H) 08/19/2015   HDL 74 08/19/2015   LDLCALC 114 (H) 08/19/2015   TRIG 84 08/19/2015   CHOLHDL 2.8 08/19/2015   zocor 40 and diet  Thinks it will look good- lab today / good diet   Hyperglycemia-has had a good diet  Not a lot of processed carbs or sweets   occ glass of wine   Patient Active Problem List   Diagnosis Date Noted  . Estrogen deficiency 08/22/2015  . Hyperglycemia 08/18/2015  . Colon cancer screening 04/20/2014  . Encounter for routine gynecological examination 11/27/2010  . Other screening mammogram 08/16/2010  . Routine general medical examination at a health care facility 08/16/2010  . Osteopenia 01/19/2008  . Hyperlipidemia 06/26/2007   Past Medical History:  Diagnosis Date  . Hyperlipidemia   . Osteopenia 01/2008   Past Surgical History:  Procedure Laterality Date  . APPENDECTOMY  1980   after rupture   Social History  Substance Use Topics  . Smoking status: Former  Smoker    Quit date: 04/02/1976  . Smokeless tobacco: Never Used  . Alcohol use 0.0 oz/week     Comment: occasional   Family History  Problem Relation Age of Onset  . Osteoporosis Mother   . Hyperlipidemia Father   . Hypertension Father    No Known Allergies Current Outpatient Prescriptions on File Prior to Visit  Medication Sig Dispense Refill  . ASHWAGANDHA PO Take 460 mg by mouth 2 (two) times daily.     . NON FORMULARY Take 1,000 mg by mouth daily. TRIPHALA     No current facility-administered medications on file prior to visit.     Review of Systems Review of Systems  Constitutional: Negative for fever, appetite change, fatigue and unexpected weight change.  Eyes: Negative for pain and visual disturbance.  Respiratory: Negative for cough and shortness of breath.   Cardiovascular: Negative for cp or palpitations    Gastrointestinal: Negative for nausea, diarrhea and constipation.  Genitourinary: Negative for urgency and frequency.  Skin: Negative for pallor or rash   Neurological: Negative for weakness, light-headedness, numbness and headaches.  Hematological: Negative for adenopathy. Does not bruise/bleed easily.  Psychiatric/Behavioral: Negative for dysphoric mood. The patient is not nervous/anxious.  Objective:   Physical Exam  Constitutional: She appears well-developed and well-nourished. No distress.  Well appearing   HENT:  Head: Normocephalic and atraumatic.  Right Ear: External ear normal.  Left Ear: External ear normal.  Mouth/Throat: Oropharynx is clear and moist.  Eyes: Pupils are equal, round, and reactive to light. Conjunctivae and EOM are normal. No scleral icterus.  Neck: Normal range of motion. Neck supple. No JVD present. Carotid bruit is not present. No thyromegaly present.  Cardiovascular: Normal rate, regular rhythm, normal heart sounds and intact distal pulses.  Exam reveals no gallop.   Pulmonary/Chest: Effort normal and breath sounds  normal. No respiratory distress. She has no wheezes. She exhibits no tenderness.  Abdominal: Soft. Bowel sounds are normal. She exhibits no distension, no abdominal bruit and no mass. There is no tenderness.  Genitourinary: No breast swelling, tenderness, discharge or bleeding.  Genitourinary Comments: Breast exam: No mass, nodules, thickening, tenderness, bulging, retraction, inflamation, nipple discharge or skin changes noted.  No axillary or clavicular LA.      Musculoskeletal: Normal range of motion. She exhibits no edema or tenderness.  Lymphadenopathy:    She has no cervical adenopathy.  Neurological: She is alert. She has normal reflexes. No cranial nerve deficit. She exhibits normal muscle tone. Coordination normal.  Skin: Skin is warm and dry. No rash noted. No erythema. No pallor.  Solar lentigines diffusely   Psychiatric: She has a normal mood and affect.          Assessment & Plan:   Problem List Items Addressed This Visit      Musculoskeletal and Integument   Osteopenia    Due for 2 y dexa No falls or fx On ca and D Walking for exercise         Other   Colon cancer screening    Info given on cologuard-she will check on coverage  Declines colonoscopy      Estrogen deficiency   Relevant Orders   DG Bone Density   Hyperglycemia    A1C today  disc imp of low glycemic diet and wt loss to prevent DM2       Relevant Orders   Hemoglobin A1c (Completed)   Hyperlipidemia    Lipid panel today  Disc goals for lipids and reasons to control them Rev labs with pt  (last check) Rev low sat fat diet in detail  Continue zocor 40      Relevant Medications   simvastatin (ZOCOR) 40 MG tablet   Routine general medical examination at a health care facility - Primary    Reviewed health habits including diet and exercise and skin cancer prevention Reviewed appropriate screening tests for age  Also reviewed health mt list, fam hx and immunization status , as well as  social and family history   See HPI Labs ordered for wellness  dexa ordered  Pt will schedule her own mammogram       Relevant Orders   CBC with Differential/Platelet (Completed)   Comprehensive metabolic panel (Completed)   Lipid panel (Completed)   TSH (Completed)

## 2016-10-30 NOTE — Patient Instructions (Addendum)
Don't forget to schedule your mammogram   We will refer you for a bone density test -stop at check out   Labs today   Call your insurance co regarding coverage of the cologuard test

## 2016-10-31 LAB — CBC WITH DIFFERENTIAL/PLATELET
BASOS ABS: 0 10*3/uL (ref 0.0–0.2)
Basos: 1 %
EOS (ABSOLUTE): 0.6 10*3/uL — ABNORMAL HIGH (ref 0.0–0.4)
Eos: 7 %
HEMOGLOBIN: 12.6 g/dL (ref 11.1–15.9)
Hematocrit: 38.8 % (ref 34.0–46.6)
IMMATURE GRANS (ABS): 0 10*3/uL (ref 0.0–0.1)
Immature Granulocytes: 0 %
LYMPHS ABS: 2.3 10*3/uL (ref 0.7–3.1)
LYMPHS: 26 %
MCH: 30.7 pg (ref 26.6–33.0)
MCHC: 32.5 g/dL (ref 31.5–35.7)
MCV: 94 fL (ref 79–97)
MONOCYTES: 7 %
Monocytes Absolute: 0.6 10*3/uL (ref 0.1–0.9)
Neutrophils Absolute: 5.1 10*3/uL (ref 1.4–7.0)
Neutrophils: 59 %
Platelets: 298 10*3/uL (ref 150–379)
RBC: 4.11 x10E6/uL (ref 3.77–5.28)
RDW: 12.6 % (ref 12.3–15.4)
WBC: 8.6 10*3/uL (ref 3.4–10.8)

## 2016-10-31 LAB — LIPID PANEL
CHOLESTEROL TOTAL: 224 mg/dL — AB (ref 100–199)
Chol/HDL Ratio: 3 ratio (ref 0.0–4.4)
HDL: 75 mg/dL (ref >39)
LDL CALC: 109 mg/dL — AB (ref 0–99)
Triglycerides: 200 mg/dL — ABNORMAL HIGH (ref 0–149)
VLDL CHOLESTEROL CAL: 40 mg/dL (ref 5–40)

## 2016-10-31 LAB — COMPREHENSIVE METABOLIC PANEL
ALBUMIN: 4.6 g/dL (ref 3.6–4.8)
ALT: 17 IU/L (ref 0–32)
AST: 21 IU/L (ref 0–40)
Albumin/Globulin Ratio: 1.6 (ref 1.2–2.2)
Alkaline Phosphatase: 61 IU/L (ref 39–117)
BUN / CREAT RATIO: 30 — AB (ref 12–28)
BUN: 22 mg/dL (ref 8–27)
Bilirubin Total: 0.3 mg/dL (ref 0.0–1.2)
CO2: 25 mmol/L (ref 20–29)
CREATININE: 0.73 mg/dL (ref 0.57–1.00)
Calcium: 10 mg/dL (ref 8.7–10.3)
Chloride: 99 mmol/L (ref 96–106)
GFR calc Af Amer: 101 mL/min/{1.73_m2} (ref >59)
GFR calc non Af Amer: 87 mL/min/{1.73_m2} (ref >59)
GLUCOSE: 83 mg/dL (ref 65–99)
Globulin, Total: 2.8 g/dL (ref 1.5–4.5)
Potassium: 4.6 mmol/L (ref 3.5–5.2)
Sodium: 138 mmol/L (ref 134–144)
TOTAL PROTEIN: 7.4 g/dL (ref 6.0–8.5)

## 2016-10-31 LAB — TSH: TSH: 0.593 u[IU]/mL (ref 0.450–4.500)

## 2016-10-31 LAB — HEMOGLOBIN A1C
Est. average glucose Bld gHb Est-mCnc: 117 mg/dL
HEMOGLOBIN A1C: 5.7 % — AB (ref 4.8–5.6)

## 2016-10-31 NOTE — Assessment & Plan Note (Signed)
Info given on cologuard-she will check on coverage  Declines colonoscopy

## 2016-10-31 NOTE — Assessment & Plan Note (Signed)
Lipid panel today  Disc goals for lipids and reasons to control them Rev labs with pt  (last check) Rev low sat fat diet in detail  Continue zocor 40

## 2016-10-31 NOTE — Assessment & Plan Note (Signed)
A1C today  disc imp of low glycemic diet and wt loss to prevent DM2  

## 2016-10-31 NOTE — Assessment & Plan Note (Signed)
Due for 2 y dexa No falls or fx On ca and D Walking for exercise

## 2016-10-31 NOTE — Assessment & Plan Note (Signed)
Reviewed health habits including diet and exercise and skin cancer prevention Reviewed appropriate screening tests for age  Also reviewed health mt list, fam hx and immunization status , as well as social and family history   See HPI Labs ordered for wellness  dexa ordered  Pt will schedule her own mammogram

## 2016-11-29 ENCOUNTER — Ambulatory Visit
Admission: RE | Admit: 2016-11-29 | Discharge: 2016-11-29 | Disposition: A | Discharge status: Home / Self Care | Payer: 59 | Source: Ambulatory Visit | Attending: Family Medicine | Admitting: Family Medicine

## 2016-11-29 DIAGNOSIS — E2839 Other primary ovarian failure: Secondary | ICD-10-CM | POA: Insufficient documentation

## 2016-11-29 DIAGNOSIS — M8588 Other specified disorders of bone density and structure, other site: Secondary | ICD-10-CM | POA: Diagnosis not present

## 2016-11-29 DIAGNOSIS — Z1231 Encounter for screening mammogram for malignant neoplasm of breast: Secondary | ICD-10-CM | POA: Diagnosis not present

## 2017-09-21 ENCOUNTER — Other Ambulatory Visit: Payer: Self-pay | Admitting: Family Medicine

## 2017-11-06 ENCOUNTER — Encounter: Payer: Self-pay | Admitting: Family Medicine

## 2017-11-06 ENCOUNTER — Ambulatory Visit (INDEPENDENT_AMBULATORY_CARE_PROVIDER_SITE_OTHER): Payer: 59 | Admitting: Family Medicine

## 2017-11-06 VITALS — BP 131/80 | HR 50 | Temp 98.3°F | Ht 59.5 in | Wt 143.5 lb

## 2017-11-06 DIAGNOSIS — E7800 Pure hypercholesterolemia, unspecified: Secondary | ICD-10-CM

## 2017-11-06 DIAGNOSIS — Z1211 Encounter for screening for malignant neoplasm of colon: Secondary | ICD-10-CM | POA: Diagnosis not present

## 2017-11-06 DIAGNOSIS — R739 Hyperglycemia, unspecified: Secondary | ICD-10-CM | POA: Diagnosis not present

## 2017-11-06 DIAGNOSIS — Z23 Encounter for immunization: Secondary | ICD-10-CM | POA: Diagnosis not present

## 2017-11-06 DIAGNOSIS — Z0000 Encounter for general adult medical examination without abnormal findings: Secondary | ICD-10-CM | POA: Diagnosis not present

## 2017-11-06 DIAGNOSIS — M8589 Other specified disorders of bone density and structure, multiple sites: Secondary | ICD-10-CM | POA: Diagnosis not present

## 2017-11-06 NOTE — Assessment & Plan Note (Signed)
Disc goals for lipids and reasons to control them Rev last labs with pt Rev low sat fat diet in detail  Labs today  Continues simvastatin 40 mg and diet

## 2017-11-06 NOTE — Assessment & Plan Note (Signed)
Pt declines any screening at this time incl cologuard or colonoscopy

## 2017-11-06 NOTE — Assessment & Plan Note (Signed)
Reviewed health habits including diet and exercise and skin cancer prevention Reviewed appropriate screening tests for age  Also reviewed health mt list, fam hx and immunization status , as well as social and family history   See HPI Labs reviewed  Tdap today  Prevnar today  Recommend flu shot in the fall  Commended exercise  Pt declines colon cancer screening  She will schedule her own mammogram

## 2017-11-06 NOTE — Assessment & Plan Note (Signed)
Reviewed dexa 8/18  Disc imp of ca and D Exercising regularly  No falls or fx Repeat in a year

## 2017-11-06 NOTE — Patient Instructions (Addendum)
Prevnar vaccine today  Tdap vaccine also   Get a flu shot in the fall   Don't forget to schedule your mammogram   Labs today   Keep up the good health habits

## 2017-11-06 NOTE — Progress Notes (Signed)
Subjective:    Patient ID: Kristin Neal, female    DOB: September 18, 1950, 67 y.o.   MRN: [id number]  HPI Here for health maintenance exam and to review chronic medical problems   Enjoyed having daughter home for the summer    Not using medicare  Wt Readings from Last 3 Encounters:  11/06/17 143 lb 8 oz (65.1 kg)  10/30/16 144 lb 4 oz (65.4 kg)  08/22/15 143 lb 8 oz (65.1 kg)  taking good care of herself  Eating right  Exercise - runs with dogs in the am and walks/uses weights / also yoga and pilates  28.50 kg/m   Colonoscopy 1/04- not interested in another one  She declines all colon cancer screening  No family hx of colon cancer   Due for prevnar  Will do that today    Pap 1/16 neg with neg HPV No gyn problems    Flu shots -will start getting in the fall   Tetanus shot 10/09 Will get Tdap today  New grandchild is due in Jan     Mammogram 8/18 neg - she got a notification/ she will schedule her own  Self breast exam - no lumps or changes   dexa 8/18 Still in osteopenia range-slt progressed at spine Falls/fractures -none  Does a lot of exercise including balance training  Takes ca and D  Blood pressure Keyed up when she came in  BP Readings from Last 3 Encounters:  11/06/17 (!) 142/86  10/30/16 126/64  08/22/15 125/70   Better on 2nd check BP: 131/80    Zoster status-had zostavax 9/14  Hyperlipidemia Lab Results  Component Value Date   CHOL 224 (H) 10/30/2016   HDL 75 10/30/2016   LDLCALC 109 (H) 10/30/2016   TRIG 200 (H) 10/30/2016   CHOLHDL 3.0 10/30/2016   Due for labs  Great diet  Fasting since 10 am  zocor 40 mg and diet   Elevated glucose Lab Results  Component Value Date   HGBA1C 5.7 (H) 10/30/2016   Due for labs   Patient Active Problem List   Diagnosis Date Noted  . Estrogen deficiency 08/22/2015  . Hyperglycemia 08/18/2015  . Colon cancer screening 04/20/2014  . Encounter for routine gynecological examination 11/27/2010  .  Other screening mammogram 08/16/2010  . Routine general medical examination at a health care facility 08/16/2010  . Osteopenia 01/19/2008  . Hyperlipidemia 06/26/2007   Past Medical History:  Diagnosis Date  . Hyperlipidemia   . Osteopenia 01/2008   Past Surgical History:  Procedure Laterality Date  . APPENDECTOMY  1980   after rupture   Social History   Tobacco Use  . Smoking status: Former Smoker    Last attempt to quit: 04/02/1976    Years since quitting: 41.6  . Smokeless tobacco: Never Used  Substance Use Topics  . Alcohol use: Yes    Alcohol/week: 0.0 oz    Comment: occasional  . Drug use: No   Family History  Problem Relation Age of Onset  . Osteoporosis Mother   . Hyperlipidemia Father   . Hypertension Father   . Breast cancer Neg Hx    No Known Allergies Current Outpatient Medications on File Prior to Visit  Medication Sig Dispense Refill  . ASHWAGANDHA PO Take 460 mg by mouth 2 (two) times daily.     . Calcium Carbonate-Vitamin D (CALCIUM-VITAMIN D3 PO) Take 1 capsule by mouth daily. Calcium 1200 mg/ vitamin d3 1000    . NON FORMULARY Take  1,000 mg by mouth daily. TRIPHALA    . simvastatin (ZOCOR) 40 MG tablet TAKE 1 TABLET BY MOUTH  DAILY 90 tablet 0   No current facility-administered medications on file prior to visit.     Review of Systems  Constitutional: Negative for activity change, appetite change, fatigue, fever and unexpected weight change.  HENT: Negative for congestion, ear pain, rhinorrhea, sinus pressure and sore throat.   Eyes: Negative for pain, redness and visual disturbance.  Respiratory: Negative for cough, shortness of breath and wheezing.   Cardiovascular: Negative for chest pain and palpitations.  Gastrointestinal: Negative for abdominal pain, blood in stool, constipation and diarrhea.  Endocrine: Negative for polydipsia and polyuria.  Genitourinary: Negative for dysuria, frequency and urgency.  Musculoskeletal: Negative for  arthralgias, back pain and myalgias.  Skin: Negative for pallor and rash.  Allergic/Immunologic: Negative for environmental allergies.  Neurological: Negative for dizziness, syncope and headaches.  Hematological: Negative for adenopathy. Does not bruise/bleed easily.  Psychiatric/Behavioral: Negative for decreased concentration and dysphoric mood. The patient is not nervous/anxious.        Objective:   Physical Exam  Constitutional: She appears well-developed and well-nourished. No distress.  Well appearing   HENT:  Head: Normocephalic and atraumatic.  Right Ear: External ear normal.  Left Ear: External ear normal.  Mouth/Throat: Oropharynx is clear and moist.  Eyes: Pupils are equal, round, and reactive to light. Conjunctivae and EOM are normal. No scleral icterus.  Neck: Normal range of motion. Neck supple. No JVD present. Carotid bruit is not present. No thyromegaly present.  Cardiovascular: Normal rate, regular rhythm, normal heart sounds and intact distal pulses. Exam reveals no gallop.  Pulmonary/Chest: Effort normal and breath sounds normal. No respiratory distress. She has no wheezes. She exhibits no tenderness. No breast tenderness, discharge or bleeding.  Abdominal: Soft. Bowel sounds are normal. She exhibits no distension, no abdominal bruit and no mass. There is no tenderness.  Genitourinary: No breast tenderness, discharge or bleeding.  Musculoskeletal: Normal range of motion. She exhibits no edema or tenderness.  No kyphosis   Lymphadenopathy:    She has no cervical adenopathy.  Neurological: She is alert. She has normal reflexes. She displays normal reflexes. No cranial nerve deficit. She exhibits normal muscle tone. Coordination normal.  Skin: Skin is warm and dry. No rash noted. No erythema. No pallor.  Solar lentigines diffusely   Psychiatric: She has a normal mood and affect.  Pleasant and talkative          Assessment & Plan:   Problem List Items Addressed  This Visit      Musculoskeletal and Integument   Osteopenia    Reviewed dexa 8/18  Disc imp of ca and D Exercising regularly  No falls or fx Repeat in a year       Relevant Orders   VITAMIN D 25 Hydroxy (Vit-D Deficiency, Fractures)     Other   Colon cancer screening    Pt declines any screening at this time incl cologuard or colonoscopy      Hyperglycemia    A1c today  Good diet and exercise      Relevant Orders   Hemoglobin A1c   Hyperlipidemia    Disc goals for lipids and reasons to control them Rev last labs with pt Rev low sat fat diet in detail  Labs today  Continues simvastatin 40 mg and diet       Relevant Orders   Lipid panel   Routine general medical examination  at a health care facility - Primary    Reviewed health habits including diet and exercise and skin cancer prevention Reviewed appropriate screening tests for age  Also reviewed health mt list, fam hx and immunization status , as well as social and family history   See HPI Labs reviewed  Tdap today  Prevnar today  Recommend flu shot in the fall  Commended exercise  Pt declines colon cancer screening  She will schedule her own mammogram       Relevant Orders   CBC with Differential/Platelet   Comprehensive metabolic panel   Lipid panel   TSH   Pneumococcal conjugate vaccine 13-valent (Completed)   Tdap vaccine greater than or equal to 7yo IM (Completed)    Other Visit Diagnoses    Need for Tdap vaccination       Relevant Orders   Tdap vaccine greater than or equal to 7yo IM (Completed)   Need for vaccination with 13-polyvalent pneumococcal conjugate vaccine       Relevant Orders   Pneumococcal conjugate vaccine 13-valent (Completed)

## 2017-11-06 NOTE — Assessment & Plan Note (Signed)
A1c today  Good diet and exercise

## 2017-11-07 ENCOUNTER — Other Ambulatory Visit: Payer: Self-pay | Admitting: Family Medicine

## 2017-11-07 DIAGNOSIS — Z1231 Encounter for screening mammogram for malignant neoplasm of breast: Secondary | ICD-10-CM

## 2017-11-07 LAB — CBC WITH DIFFERENTIAL/PLATELET
BASOS: 1 %
Basophils Absolute: 0 10*3/uL (ref 0.0–0.2)
EOS (ABSOLUTE): 0.6 10*3/uL — AB (ref 0.0–0.4)
EOS: 9 %
HEMATOCRIT: 38.2 % (ref 34.0–46.6)
Hemoglobin: 12.8 g/dL (ref 11.1–15.9)
Immature Grans (Abs): 0 10*3/uL (ref 0.0–0.1)
Immature Granulocytes: 0 %
LYMPHS ABS: 2.4 10*3/uL (ref 0.7–3.1)
Lymphs: 34 %
MCH: 30.5 pg (ref 26.6–33.0)
MCHC: 33.5 g/dL (ref 31.5–35.7)
MCV: 91 fL (ref 79–97)
Monocytes Absolute: 0.5 10*3/uL (ref 0.1–0.9)
Monocytes: 7 %
NEUTROS PCT: 49 %
Neutrophils Absolute: 3.6 10*3/uL (ref 1.4–7.0)
PLATELETS: 284 10*3/uL (ref 150–450)
RBC: 4.19 x10E6/uL (ref 3.77–5.28)
RDW: 12.7 % (ref 12.3–15.4)
WBC: 7.2 10*3/uL (ref 3.4–10.8)

## 2017-11-07 LAB — COMPREHENSIVE METABOLIC PANEL
A/G RATIO: 1.7 (ref 1.2–2.2)
ALK PHOS: 75 IU/L (ref 39–117)
ALT: 18 IU/L (ref 0–32)
AST: 24 IU/L (ref 0–40)
Albumin: 4.7 g/dL (ref 3.6–4.8)
BILIRUBIN TOTAL: 0.3 mg/dL (ref 0.0–1.2)
BUN/Creatinine Ratio: 21 (ref 12–28)
BUN: 17 mg/dL (ref 8–27)
CHLORIDE: 102 mmol/L (ref 96–106)
CO2: 23 mmol/L (ref 20–29)
Calcium: 9.9 mg/dL (ref 8.7–10.3)
Creatinine, Ser: 0.82 mg/dL (ref 0.57–1.00)
GFR calc Af Amer: 87 mL/min/{1.73_m2} (ref >59)
GFR, EST NON AFRICAN AMERICAN: 75 mL/min/{1.73_m2} (ref >59)
GLOBULIN, TOTAL: 2.8 g/dL (ref 1.5–4.5)
Glucose: 90 mg/dL (ref 65–99)
POTASSIUM: 4.3 mmol/L (ref 3.5–5.2)
SODIUM: 138 mmol/L (ref 134–144)
Total Protein: 7.5 g/dL (ref 6.0–8.5)

## 2017-11-07 LAB — LIPID PANEL
CHOL/HDL RATIO: 2.7 ratio (ref 0.0–4.4)
CHOLESTEROL TOTAL: 202 mg/dL — AB (ref 100–199)
HDL: 76 mg/dL (ref >39)
LDL Calculated: 105 mg/dL — ABNORMAL HIGH (ref 0–99)
TRIGLYCERIDES: 104 mg/dL (ref 0–149)
VLDL Cholesterol Cal: 21 mg/dL (ref 5–40)

## 2017-11-07 LAB — VITAMIN D 25 HYDROXY (VIT D DEFICIENCY, FRACTURES): Vit D, 25-Hydroxy: 33.2 ng/mL (ref 30.0–100.0)

## 2017-11-07 LAB — HEMOGLOBIN A1C
Est. average glucose Bld gHb Est-mCnc: 120 mg/dL
HEMOGLOBIN A1C: 5.8 % — AB (ref 4.8–5.6)

## 2017-11-07 LAB — TSH: TSH: 0.733 u[IU]/mL (ref 0.450–4.500)

## 2017-12-03 ENCOUNTER — Other Ambulatory Visit: Payer: Self-pay | Admitting: Family Medicine

## 2017-12-03 ENCOUNTER — Ambulatory Visit
Admission: RE | Admit: 2017-12-03 | Discharge: 2017-12-03 | Disposition: A | Discharge status: Home / Self Care | Payer: 59 | Source: Ambulatory Visit | Attending: Family Medicine | Admitting: Family Medicine

## 2017-12-03 DIAGNOSIS — Z1231 Encounter for screening mammogram for malignant neoplasm of breast: Secondary | ICD-10-CM | POA: Diagnosis present

## 2017-12-04 ENCOUNTER — Other Ambulatory Visit: Payer: Self-pay | Admitting: Family Medicine

## 2017-12-04 DIAGNOSIS — R928 Other abnormal and inconclusive findings on diagnostic imaging of breast: Secondary | ICD-10-CM

## 2017-12-16 ENCOUNTER — Other Ambulatory Visit: Payer: Self-pay | Admitting: Family Medicine

## 2017-12-16 ENCOUNTER — Ambulatory Visit
Admission: RE | Admit: 2017-12-16 | Discharge: 2017-12-16 | Disposition: A | Discharge status: Home / Self Care | Payer: 59 | Source: Ambulatory Visit | Attending: Family Medicine | Admitting: Family Medicine

## 2017-12-16 DIAGNOSIS — R928 Other abnormal and inconclusive findings on diagnostic imaging of breast: Secondary | ICD-10-CM

## 2018-01-02 ENCOUNTER — Ambulatory Visit: Payer: 59

## 2018-01-16 ENCOUNTER — Ambulatory Visit
Admission: RE | Admit: 2018-01-16 | Discharge: 2018-01-16 | Disposition: A | Discharge status: Home / Self Care | Payer: 59 | Source: Ambulatory Visit | Attending: Family Medicine | Admitting: Family Medicine

## 2018-01-16 DIAGNOSIS — R928 Other abnormal and inconclusive findings on diagnostic imaging of breast: Secondary | ICD-10-CM | POA: Insufficient documentation

## 2018-01-16 HISTORY — PX: BREAST BIOPSY: SHX20

## 2018-01-20 LAB — SURGICAL PATHOLOGY

## 2018-03-27 ENCOUNTER — Telehealth: Payer: Self-pay

## 2018-03-27 MED ORDER — OSELTAMIVIR PHOSPHATE 75 MG PO CAPS: 75.0000 mg | ORAL_CAPSULE | Freq: Every day | ORAL | 0 refills | Status: DC

## 2018-03-27 NOTE — Telephone Encounter (Signed)
Pt notified Rx sent 

## 2018-03-27 NOTE — Telephone Encounter (Signed)
Px sent

## 2018-03-27 NOTE — Telephone Encounter (Signed)
Pt said she was exposed to someone with confirmed flu on 03/26/18. Pt request tamiflu [facility]. Pt has no symptoms now. Pt request cb after Dr Glori Bickers reviews note.

## 2018-10-04 ENCOUNTER — Other Ambulatory Visit: Payer: Self-pay | Admitting: Family Medicine

## 2018-11-11 ENCOUNTER — Encounter: Payer: Self-pay | Admitting: Family Medicine

## 2018-11-11 ENCOUNTER — Ambulatory Visit (INDEPENDENT_AMBULATORY_CARE_PROVIDER_SITE_OTHER): Payer: 59 | Admitting: Family Medicine

## 2018-11-11 ENCOUNTER — Other Ambulatory Visit: Payer: Self-pay

## 2018-11-11 VITALS — BP 126/72 | HR 60 | Temp 98.0°F | Ht 59.5 in | Wt 143.4 lb

## 2018-11-11 DIAGNOSIS — Z23 Encounter for immunization: Secondary | ICD-10-CM

## 2018-11-11 DIAGNOSIS — Z0000 Encounter for general adult medical examination without abnormal findings: Secondary | ICD-10-CM

## 2018-11-11 DIAGNOSIS — R7303 Prediabetes: Secondary | ICD-10-CM

## 2018-11-11 DIAGNOSIS — Z1211 Encounter for screening for malignant neoplasm of colon: Secondary | ICD-10-CM

## 2018-11-11 DIAGNOSIS — E7800 Pure hypercholesterolemia, unspecified: Secondary | ICD-10-CM

## 2018-11-11 DIAGNOSIS — M8589 Other specified disorders of bone density and structure, multiple sites: Secondary | ICD-10-CM | POA: Diagnosis not present

## 2018-11-11 MED ORDER — SIMVASTATIN 40 MG PO TABS: 40.0000 mg | ORAL_TABLET | Freq: Every day | ORAL | 3 refills | Status: DC

## 2018-11-11 NOTE — Assessment & Plan Note (Signed)
Disc goals for lipids and reasons to control them Rev last labs with pt Rev low sat fat diet in detail Labs today  Continues simvastatin and diet

## 2018-11-11 NOTE — Assessment & Plan Note (Signed)
Declines screening of any kind

## 2018-11-11 NOTE — Assessment & Plan Note (Signed)
8/18 dexa  No falls or fractures  Good exercise  Takes D and ca Will put off dexa another year in light of pandemic

## 2018-11-11 NOTE — Assessment & Plan Note (Signed)
Due for a1c Diet is healthy  disc imp of low glycemic diet and wt loss to prevent DM2

## 2018-11-11 NOTE — Progress Notes (Signed)
Subjective:    Patient ID: Kristin Neal, female    DOB: June 12, 1950, 68 y.o.   MRN: [id number]  HPI Here for health maintenance exam and to review chronic medical problems    Also needs labs   Plans to retire in December   Colon cancer screening - colonoscopy 1/04 Declines screening   PNA vaccine due for PNA 23  Had prevnar a year ago  Zoster vaccine 9/14 Interested in shingrix  Tdap 8/19   Flu shots -started getting last year   Mammogram 9/19 -addn views done/biopsy was B9 - fibrous tissue  She was supposed to f/u and did not  bx site is ok  Mammogram due in sept  Self breast exam -no new lumps or changes   dexa 8/18 osteopenia Falls-none  Fractures -none  Supplements taking D and Ca  Exercise - walks 2 mi per day/ head stand/plank and run in place for 3 minutes    Wt Readings from Last 3 Encounters:  11/11/18 143 lb 7 oz (65.1 kg)  11/06/17 143 lb 8 oz (65.1 kg)  10/30/16 144 lb 4 oz (65.4 kg)  eats a healthy diet /stable  28.49 kg/m   Prediabetes Lab Results  Component Value Date   HGBA1C 5.8 (H) 11/06/2017  mindful of sugar in diet  Drinks wine on the weekends  Due for labs   Hyperlipidemia Lab Results  Component Value Date   CHOL 202 (H) 11/06/2017   HDL 76 11/06/2017   LDLCALC 105 (H) 11/06/2017   TRIG 104 11/06/2017   CHOLHDL 2.7 11/06/2017   Due for labs  Statin and diet   Patient Active Problem List   Diagnosis Date Noted  . Estrogen deficiency 08/22/2015  . Prediabetes 08/18/2015  . Colon cancer screening 04/20/2014  . Encounter for routine gynecological examination 11/27/2010  . Other screening mammogram 08/16/2010  . Routine general medical examination at a health care facility 08/16/2010  . Osteopenia 01/19/2008  . Hyperlipidemia 06/26/2007   Past Medical History:  Diagnosis Date  . Hyperlipidemia   . Osteopenia 01/2008   Past Surgical History:  Procedure Laterality Date  . APPENDECTOMY  1980   after rupture  . BREAST  BIOPSY Left 01/16/2018   affirm stereo biopsy/ path pending   Social History   Tobacco Use  . Smoking status: Former Smoker    Quit date: 04/02/1976    Years since quitting: 42.6  . Smokeless tobacco: Never Used  Substance Use Topics  . Alcohol use: Yes    Alcohol/week: 0.0 standard drinks    Comment: occasional  . Drug use: No   Family History  Problem Relation Age of Onset  . Osteoporosis Mother   . Hyperlipidemia Father   . Hypertension Father   . Breast cancer Neg Hx    No Known Allergies Current Outpatient Medications on File Prior to Visit  Medication Sig Dispense Refill  . ASHWAGANDHA PO Take 460 mg by mouth 2 (two) times daily.     . Calcium Carbonate-Vitamin D (CALCIUM-VITAMIN D3 PO) Take 1 capsule by mouth daily. Calcium 1200 mg/ vitamin d3 1000    . NON FORMULARY Take 1,000 mg by mouth daily. TRIPHALA     No current facility-administered medications on file prior to visit.      Review of Systems  Constitutional: Negative for activity change, appetite change, fatigue, fever and unexpected weight change.  HENT: Negative for congestion, ear pain, rhinorrhea, sinus pressure and sore throat.   Eyes: Negative for pain,  redness and visual disturbance.  Respiratory: Negative for cough, shortness of breath and wheezing.   Cardiovascular: Negative for chest pain and palpitations.  Gastrointestinal: Negative for abdominal pain, blood in stool, constipation and diarrhea.  Endocrine: Negative for polydipsia and polyuria.  Genitourinary: Negative for dysuria, frequency and urgency.  Musculoskeletal: Negative for arthralgias, back pain and myalgias.  Skin: Negative for pallor and rash.  Allergic/Immunologic: Negative for environmental allergies.  Neurological: Negative for dizziness, syncope and headaches.  Hematological: Negative for adenopathy. Does not bruise/bleed easily.  Psychiatric/Behavioral: Negative for decreased concentration and dysphoric mood. The patient is not  nervous/anxious.        Objective:   Physical Exam Constitutional:      General: She is not in acute distress.    Appearance: Normal appearance. She is well-developed and normal weight. She is not ill-appearing.  HENT:     Head: Normocephalic and atraumatic.     Right Ear: Tympanic membrane, ear canal and external ear normal.     Left Ear: Tympanic membrane, ear canal and external ear normal.     Nose: Nose normal.     Mouth/Throat:     Mouth: Mucous membranes are moist.     Pharynx: Oropharynx is clear. No posterior oropharyngeal erythema.  Eyes:     General: No scleral icterus.    Conjunctiva/sclera: Conjunctivae normal.     Pupils: Pupils are equal, round, and reactive to light.  Neck:     Musculoskeletal: Normal range of motion and neck supple. No neck rigidity or muscular tenderness.     Thyroid: No thyromegaly.     Vascular: No carotid bruit or JVD.  Cardiovascular:     Rate and Rhythm: Normal rate and regular rhythm.     Pulses: Normal pulses.     Heart sounds: Normal heart sounds. No gallop.   Pulmonary:     Effort: Pulmonary effort is normal. No respiratory distress.     Breath sounds: Normal breath sounds. No wheezing.     Comments: Good air exch Chest:     Chest wall: No tenderness.  Abdominal:     General: Bowel sounds are normal. There is no distension or abdominal bruit.     Palpations: Abdomen is soft. There is no mass.     Tenderness: There is no abdominal tenderness.     Hernia: No hernia is present.  Genitourinary:    Comments: Breast exam: No mass, nodules, thickening, tenderness, bulging, retraction, inflamation, nipple discharge or skin changes noted.  No axillary or clavicular LA.     Musculoskeletal: Normal range of motion.        General: No tenderness.     Right lower leg: No edema.     Left lower leg: No edema.     Comments: No kyphosis   Lymphadenopathy:     Cervical: No cervical adenopathy.  Skin:    General: Skin is warm and dry.      Coloration: Skin is not pale.     Findings: No erythema or rash.     Comments: Solar lentigines diffusely   Neurological:     Mental Status: She is alert. Mental status is at baseline.     Cranial Nerves: No cranial nerve deficit.     Motor: No abnormal muscle tone.     Coordination: Coordination normal.     Gait: Gait normal.     Deep Tendon Reflexes: Reflexes are normal and symmetric. Reflexes normal.  Psychiatric:  Mood and Affect: Mood normal.        Cognition and Memory: Cognition and memory normal.           Assessment & Plan:   Problem List Items Addressed This Visit      Musculoskeletal and Integument   Osteopenia    8/18 dexa  No falls or fractures  Good exercise  Takes D and ca Will put off dexa another year in light of pandemic        Relevant Orders   VITAMIN D 25 Hydroxy (Vit-D Deficiency, Fractures)     Other   Hyperlipidemia    Disc goals for lipids and reasons to control them Rev last labs with pt Rev low sat fat diet in detail Labs today  Continues simvastatin and diet      Relevant Medications   simvastatin (ZOCOR) 40 MG tablet   Other Relevant Orders   Comprehensive metabolic panel   Lipid panel   Routine general medical examination at a health care facility - Primary    Reviewed health habits including diet and exercise and skin cancer prevention Reviewed appropriate screening tests for age  Also reviewed health mt list, fam hx and immunization status , as well as social and family history   See HPI Labs ordered  Still working-may retire w/in the year  PNA 23 vaccine today  Enc to get a flu shot in the fall  Disc checking on coverage for shingrix vaccine in the future Pt will schedule her own mammogram in sept (had b9 bx last time)  Enc continued good exercise AVS: Please get a flu shot in the fall   Pneumonia vaccine 23 today   If you are interested in the new shingles vaccine (Shingrix) - call your local pharmacy to check  on coverage and availability  If affordable, get on a wait list at your pharmacy to get the vaccine.  Make sure to schedule your mammogram for sept   Labs today       Relevant Orders   CBC with Differential/Platelet   Comprehensive metabolic panel   Lipid panel   TSH   Pneumococcal polysaccharide vaccine 23-valent greater than or equal to 2yo subcutaneous/IM (Completed)   Colon cancer screening    Declines screening of any kind      Prediabetes    Due for a1c Diet is healthy  disc imp of low glycemic diet and wt loss to prevent DM2       Relevant Orders   Hemoglobin A1c    Other Visit Diagnoses    Need for 23-polyvalent pneumococcal polysaccharide vaccine       Relevant Orders   Pneumococcal polysaccharide vaccine 23-valent greater than or equal to 2yo subcutaneous/IM (Completed)

## 2018-11-11 NOTE — Assessment & Plan Note (Signed)
Reviewed health habits including diet and exercise and skin cancer prevention Reviewed appropriate screening tests for age  Also reviewed health mt list, fam hx and immunization status , as well as social and family history   See HPI Labs ordered  Still working-may retire w/in the year  PNA 23 vaccine today  Enc to get a flu shot in the fall  Disc checking on coverage for shingrix vaccine in the future Pt will schedule her own mammogram in sept (had b9 bx last time)  Enc continued good exercise AVS: Please get a flu shot in the fall   Pneumonia vaccine 23 today   If you are interested in the new shingles vaccine (Shingrix) - call your local pharmacy to check on coverage and availability  If affordable, get on a wait list at your pharmacy to get the vaccine.  Make sure to schedule your mammogram for sept   Labs today

## 2018-11-11 NOTE — Patient Instructions (Addendum)
Please get a flu shot in the fall   Pneumonia vaccine 23 today   If you are interested in the new shingles vaccine (Shingrix) - call your local pharmacy to check on coverage and availability  If affordable, get on a wait list at your pharmacy to get the vaccine.  Make sure to schedule your mammogram for sept   Labs today

## 2018-11-12 LAB — COMPREHENSIVE METABOLIC PANEL
ALT: 17 IU/L (ref 0–32)
AST: 21 IU/L (ref 0–40)
Albumin/Globulin Ratio: 1.8 (ref 1.2–2.2)
Albumin: 4.9 g/dL — ABNORMAL HIGH (ref 3.8–4.8)
Alkaline Phosphatase: 70 IU/L (ref 39–117)
BUN/Creatinine Ratio: 20 (ref 12–28)
BUN: 15 mg/dL (ref 8–27)
Bilirubin Total: 0.4 mg/dL (ref 0.0–1.2)
CO2: 24 mmol/L (ref 20–29)
Calcium: 10.2 mg/dL (ref 8.7–10.3)
Chloride: 99 mmol/L (ref 96–106)
Creatinine, Ser: 0.76 mg/dL (ref 0.57–1.00)
GFR calc Af Amer: 95 mL/min/{1.73_m2} (ref >59)
GFR calc non Af Amer: 82 mL/min/{1.73_m2} (ref >59)
Globulin, Total: 2.7 g/dL (ref 1.5–4.5)
Glucose: 90 mg/dL (ref 65–99)
Potassium: 4.2 mmol/L (ref 3.5–5.2)
Sodium: 138 mmol/L (ref 134–144)
Total Protein: 7.6 g/dL (ref 6.0–8.5)

## 2018-11-12 LAB — CBC WITH DIFFERENTIAL/PLATELET
Basophils Absolute: 0.1 10*3/uL (ref 0.0–0.2)
Basos: 1 %
EOS (ABSOLUTE): 0.7 10*3/uL — ABNORMAL HIGH (ref 0.0–0.4)
Eos: 9 %
Hematocrit: 39.8 % (ref 34.0–46.6)
Hemoglobin: 13.1 g/dL (ref 11.1–15.9)
Immature Grans (Abs): 0 10*3/uL (ref 0.0–0.1)
Immature Granulocytes: 0 %
Lymphocytes Absolute: 2.4 10*3/uL (ref 0.7–3.1)
Lymphs: 32 %
MCH: 30.3 pg (ref 26.6–33.0)
MCHC: 32.9 g/dL (ref 31.5–35.7)
MCV: 92 fL (ref 79–97)
Monocytes Absolute: 0.5 10*3/uL (ref 0.1–0.9)
Monocytes: 7 %
Neutrophils Absolute: 3.9 10*3/uL (ref 1.4–7.0)
Neutrophils: 51 %
Platelets: 268 10*3/uL (ref 150–450)
RBC: 4.33 x10E6/uL (ref 3.77–5.28)
RDW: 11.6 % — ABNORMAL LOW (ref 11.7–15.4)
WBC: 7.5 10*3/uL (ref 3.4–10.8)

## 2018-11-12 LAB — LIPID PANEL
Chol/HDL Ratio: 2.6 ratio (ref 0.0–4.4)
Cholesterol, Total: 211 mg/dL — ABNORMAL HIGH (ref 100–199)
HDL: 80 mg/dL (ref >39)
LDL Calculated: 105 mg/dL — ABNORMAL HIGH (ref 0–99)
Triglycerides: 131 mg/dL (ref 0–149)
VLDL Cholesterol Cal: 26 mg/dL (ref 5–40)

## 2018-11-12 LAB — TSH: TSH: 0.747 u[IU]/mL (ref 0.450–4.500)

## 2018-11-12 LAB — HEMOGLOBIN A1C
Est. average glucose Bld gHb Est-mCnc: 117 mg/dL
Hgb A1c MFr Bld: 5.7 % — ABNORMAL HIGH (ref 4.8–5.6)

## 2018-11-12 LAB — VITAMIN D 25 HYDROXY (VIT D DEFICIENCY, FRACTURES): Vit D, 25-Hydroxy: 34.7 ng/mL (ref 30.0–100.0)

## 2019-11-13 ENCOUNTER — Encounter: Payer: Self-pay | Admitting: Family Medicine

## 2019-11-17 ENCOUNTER — Other Ambulatory Visit: Payer: Self-pay | Admitting: Family Medicine

## 2019-11-24 ENCOUNTER — Encounter: Payer: Self-pay | Admitting: Family Medicine

## 2019-11-24 ENCOUNTER — Other Ambulatory Visit: Payer: Self-pay

## 2019-11-24 ENCOUNTER — Ambulatory Visit (INDEPENDENT_AMBULATORY_CARE_PROVIDER_SITE_OTHER): Payer: Medicare Other | Admitting: Family Medicine

## 2019-11-24 VITALS — BP 136/72 | HR 59 | Temp 96.9°F | Ht 59.5 in | Wt 142.4 lb

## 2019-11-24 DIAGNOSIS — E7800 Pure hypercholesterolemia, unspecified: Secondary | ICD-10-CM

## 2019-11-24 DIAGNOSIS — R7303 Prediabetes: Secondary | ICD-10-CM | POA: Diagnosis not present

## 2019-11-24 DIAGNOSIS — Z0000 Encounter for general adult medical examination without abnormal findings: Secondary | ICD-10-CM | POA: Insufficient documentation

## 2019-11-24 DIAGNOSIS — Z1231 Encounter for screening mammogram for malignant neoplasm of breast: Secondary | ICD-10-CM

## 2019-11-24 DIAGNOSIS — E2839 Other primary ovarian failure: Secondary | ICD-10-CM | POA: Diagnosis not present

## 2019-11-24 DIAGNOSIS — N6489 Other specified disorders of breast: Secondary | ICD-10-CM

## 2019-11-24 DIAGNOSIS — Z1211 Encounter for screening for malignant neoplasm of colon: Secondary | ICD-10-CM

## 2019-11-24 DIAGNOSIS — M8589 Other specified disorders of bone density and structure, multiple sites: Secondary | ICD-10-CM

## 2019-11-24 LAB — CBC WITH DIFFERENTIAL/PLATELET
Basophils Absolute: 0 10*3/uL (ref 0.0–0.1)
Basophils Relative: 0.6 % (ref 0.0–3.0)
Eosinophils Absolute: 0.6 10*3/uL (ref 0.0–0.7)
Eosinophils Relative: 7.7 % — ABNORMAL HIGH (ref 0.0–5.0)
HCT: 39.3 % (ref 36.0–46.0)
Hemoglobin: 13 g/dL (ref 12.0–15.0)
Lymphocytes Relative: 24.3 % (ref 12.0–46.0)
Lymphs Abs: 1.8 10*3/uL (ref 0.7–4.0)
MCHC: 33.1 g/dL (ref 30.0–36.0)
MCV: 93.6 fl (ref 78.0–100.0)
Monocytes Absolute: 0.7 10*3/uL (ref 0.1–1.0)
Monocytes Relative: 9.6 % (ref 3.0–12.0)
Neutro Abs: 4.3 10*3/uL (ref 1.4–7.7)
Neutrophils Relative %: 57.8 % (ref 43.0–77.0)
Platelets: 212 10*3/uL (ref 150.0–400.0)
RBC: 4.2 Mil/uL (ref 3.87–5.11)
RDW: 12.4 % (ref 11.5–15.5)
WBC: 7.5 10*3/uL (ref 4.0–10.5)

## 2019-11-24 LAB — LIPID PANEL
Cholesterol: 205 mg/dL — ABNORMAL HIGH (ref 0–200)
HDL: 66.7 mg/dL (ref >39.00)
NonHDL: 138.59
Total CHOL/HDL Ratio: 3
Triglycerides: 244 mg/dL — ABNORMAL HIGH (ref 0.0–149.0)
VLDL: 48.8 mg/dL — ABNORMAL HIGH (ref 0.0–40.0)

## 2019-11-24 LAB — COMPREHENSIVE METABOLIC PANEL
ALT: 22 U/L (ref 0–35)
AST: 25 U/L (ref 0–37)
Albumin: 4.5 g/dL (ref 3.5–5.2)
Alkaline Phosphatase: 59 U/L (ref 39–117)
BUN: 18 mg/dL (ref 6–23)
CO2: 31 mEq/L (ref 19–32)
Calcium: 9.9 mg/dL (ref 8.4–10.5)
Chloride: 101 mEq/L (ref 96–112)
Creatinine, Ser: 0.76 mg/dL (ref 0.40–1.20)
GFR: 75.72 mL/min (ref >60.00)
Glucose, Bld: 91 mg/dL (ref 70–99)
Potassium: 4.9 mEq/L (ref 3.5–5.1)
Sodium: 137 mEq/L (ref 135–145)
Total Bilirubin: 0.5 mg/dL (ref 0.2–1.2)
Total Protein: 7.3 g/dL (ref 6.0–8.3)

## 2019-11-24 LAB — HEMOGLOBIN A1C: Hgb A1c MFr Bld: 6 % (ref 4.6–6.5)

## 2019-11-24 LAB — TSH: TSH: 0.84 u[IU]/mL (ref 0.35–4.50)

## 2019-11-24 LAB — LDL CHOLESTEROL, DIRECT: Direct LDL: 107 mg/dL

## 2019-11-24 MED ORDER — SIMVASTATIN 40 MG PO TABS: 40.0000 mg | ORAL_TABLET | Freq: Every day | ORAL | 3 refills | Status: DC

## 2019-11-24 NOTE — Progress Notes (Signed)
Subjective:    Patient ID: Kristin Neal, female    DOB: 1950/11/08, 69 y.o.   MRN: [id number]  This visit occurred during the SARS-CoV-2 public health emergency.  Safety protocols were in place, including screening questions prior to the visit, additional usage of staff PPE, and extensive cleaning of exam room while observing appropriate contact time as indicated for disinfecting solutions.    HPI Pt presents for welcome to medicare visit and review of chronic medical problems  I have personally reviewed the Medicare Annual Wellness questionnaire and have noted 1. The patient's medical and social history 2. Their use of alcohol, tobacco or illicit drugs 3. Their current medications and supplements 4. The patient's functional ability including ADL's, fall risks, home safety risks and hearing or visual             impairment. 5. Diet and physical activities 6. Evidence for depression or mood disorders  The patients weight, height, BMI have been recorded in the chart and visual acuity is per eye clinic.  I have made referrals, counseling and provided education to the patient based review of the above and I have provided the pt with a written personalized care plan for preventive services. Reviewed and updated provider list, see scanned forms.  See scanned forms.  Routine anticipatory guidance given to patient.  See health maintenance. Colon cancer screening    Colonoscopy 2004 and has declined screening since Breast cancer screening  Mammogram 9/19  She had repeat and then bx which was benign (at that time 6 mo f/u diag L mammogram was recommended)  Self breast exam-no lumps or chagnes  Flu vaccine- had this month, high dose Tetanus vaccine  Tdap 8/19  covid status -immunized Pneumovax-completed Zoster vaccine-zostavax 9/14  Dexa  8/18 -osteopenia (worst at spine) lowest T score -2.2 Falls-none  Fractures- none  Supplements -taking D3 2000 iu daily  Exercise - walking and  stretching/some yoga    Advance directive-has updated adv directive  Cognitive function addressed- see scanned forms- and if abnormal then additional documentation follows.   Does very well overall  Stays engaged  Does finances /directions -no problems   Stays busy [place] to read Is an extravert    PMH and SH reviewed  Meds, vitals, and allergies reviewed.   ROS: See HPI.  Otherwise negative.    Care team  Taaliyah Delpriore- pcp [facility]- opthy  She retired in January Daughter got married in march  Did projects and cared for grandkids once per week  Wants to do something next-unsure what   Weight : Wt Readings from Last 3 Encounters:  11/24/19 142 lb 6 oz (64.6 kg)  11/11/18 143 lb 7 oz (65.1 kg)  11/06/17 143 lb 8 oz (65.1 kg)  works hard to [employer] her weight  Eats very healthy  Exercises regularly  28.27 kg/m   Hearing/vision:  Hearing Screening   125Hz  250Hz  500Hz  1000Hz  2000Hz  3000Hz  4000Hz  6000Hz  8000Hz   Right ear:   40 40 40  40    Left ear:   40 40 40  40      Visual Acuity Screening   Right eye Left eye Both eyes  Without correction:     With correction: 20/25 20/30 20/25   \  Hyperlipidemia Lab Results  Component Value Date   CHOL 211 (H) 11/11/2018   HDL 80 11/11/2018   LDLCALC 105 (H) 11/11/2018   TRIG 131 11/11/2018   CHOLHDL 2.6 11/11/2018   Taking simvastatin  Due for lab today Diet   Prediabetes Lab Results  Component Value Date   HGBA1C 5.7 (H) 11/11/2018  due for lab today    BP Readings from Last 3 Encounters:  11/24/19 (!) 142/76  11/11/18 126/72  11/06/17 131/80   2nd check BP: 136/72   Pulse Readings from Last 3 Encounters:  11/24/19 (!) 59  11/11/18 60  11/06/17 (!) 50    Patient Active Problem List   Diagnosis Date Noted  . Welcome to Medicare preventive visit 11/24/2019  . Screening mammogram, encounter for 11/24/2019  . Distortion of contour of breast 11/24/2019  . Estrogen deficiency 08/22/2015    . Prediabetes 08/18/2015  . Colon cancer screening 04/20/2014  . Encounter for routine gynecological examination 11/27/2010  . Other screening mammogram 08/16/2010  . Routine general medical examination at a health care facility 08/16/2010  . Osteopenia 01/19/2008  . Hyperlipidemia 06/26/2007   Past Medical History:  Diagnosis Date  . Hyperlipidemia   . Osteopenia 01/2008   Past Surgical History:  Procedure Laterality Date  . APPENDECTOMY  1980   after rupture  . BREAST BIOPSY Left 01/16/2018   affirm stereo biopsy/ path pending   Social History   Tobacco Use  . Smoking status: Former Smoker    Quit date: 04/02/1976    Years since quitting: 43.6  . Smokeless tobacco: Never Used  Substance Use Topics  . Alcohol use: Yes    Alcohol/week: 0.0 standard drinks    Comment: occasional  . Drug use: No   Family History  Problem Relation Age of Onset  . Osteoporosis Mother   . Hyperlipidemia Father   . Hypertension Father   . Breast cancer Neg Hx    No Known Allergies Current Outpatient Medications on File Prior to Visit  Medication Sig Dispense Refill  . ASHWAGANDHA PO Take 460 mg by mouth daily.     . Calcium Carbonate-Vitamin D (CALCIUM-VITAMIN D3 PO) Take 1 capsule by mouth daily. Calcium 1200 mg/ vitamin d3 2000    . NON FORMULARY Take 1,000 mg by mouth daily. TRIPHALA    . Omega-3 1000 MG CAPS Take 1 capsule by mouth daily.     No current facility-administered medications on file prior to visit.     Review of Systems  Constitutional: Negative for activity change, appetite change, fatigue, fever and unexpected weight change.  HENT: Negative for congestion, ear pain, rhinorrhea, sinus pressure and sore throat.   Eyes: Negative for pain, redness and visual disturbance.  Respiratory: Negative for cough, shortness of breath and wheezing.   Cardiovascular: Negative for chest pain and palpitations.  Gastrointestinal: Negative for abdominal pain, blood in stool,  constipation and diarrhea.  Endocrine: Negative for polydipsia and polyuria.  Genitourinary: Negative for dysuria, frequency and urgency.  Musculoskeletal: Negative for arthralgias, back pain and myalgias.  Skin: Negative for pallor and rash.  Allergic/Immunologic: Negative for environmental allergies.  Neurological: Negative for dizziness, syncope and headaches.  Hematological: Negative for adenopathy. Does not bruise/bleed easily.  Psychiatric/Behavioral: Negative for decreased concentration and dysphoric mood. The patient is not nervous/anxious.        Objective:   Physical Exam Constitutional:      General: She is not in acute distress.    Appearance: Normal appearance. She is well-developed and normal weight. She is not ill-appearing or diaphoretic.  HENT:     Head: Normocephalic and atraumatic.     Right Ear: Tympanic membrane, ear canal and external ear normal.     Left  Ear: Tympanic membrane, ear canal and external ear normal.     Nose: Nose normal. No congestion.     Mouth/Throat:     Mouth: Mucous membranes are moist.     Pharynx: Oropharynx is clear. No posterior oropharyngeal erythema.  Eyes:     General: No scleral icterus.    Extraocular Movements: Extraocular movements intact.     Conjunctiva/sclera: Conjunctivae normal.     Pupils: Pupils are equal, round, and reactive to light.  Neck:     Thyroid: No thyromegaly.     Vascular: No carotid bruit or JVD.  Cardiovascular:     Rate and Rhythm: Normal rate and regular rhythm.     Pulses: Normal pulses.     Heart sounds: Normal heart sounds. No gallop.   Pulmonary:     Effort: Pulmonary effort is normal. No respiratory distress.     Breath sounds: Normal breath sounds. No wheezing.     Comments: Good air exch Chest:     Chest wall: No tenderness.  Abdominal:     General: Bowel sounds are normal. There is no distension or abdominal bruit.     Palpations: Abdomen is soft. There is no mass.     Tenderness: There  is no abdominal tenderness.     Hernia: No hernia is present.  Genitourinary:    Comments: Breast exam: No mass, nodules, thickening, tenderness, bulging, retraction, inflamation, nipple discharge or skin changes noted.  No axillary or clavicular LA.     Musculoskeletal:        General: No tenderness. Normal range of motion.     Cervical back: Normal range of motion and neck supple. No rigidity. No muscular tenderness.     Right lower leg: No edema.     Left lower leg: No edema.  Lymphadenopathy:     Cervical: No cervical adenopathy.  Skin:    General: Skin is warm and dry.     Coloration: Skin is not pale.     Findings: No erythema or rash.     Comments: Solar lentigines diffusely   Neurological:     Mental Status: She is alert. Mental status is at baseline.     Cranial Nerves: No cranial nerve deficit.     Motor: No abnormal muscle tone.     Coordination: Coordination normal.     Gait: Gait normal.     Deep Tendon Reflexes: Reflexes are normal and symmetric. Reflexes normal.  Psychiatric:        Mood and Affect: Mood normal.        Cognition and Memory: Cognition and memory normal.     Comments: pleasant           Assessment & Plan:   Problem List Items Addressed This Visit      Musculoskeletal and Integument   Osteopenia    Due for 3 y dexa-ordered and pt will call to schedule No falls or fractures  Taking ca and D Good exercise habits        Other   Hyperlipidemia    Disc goals for lipids and reasons to control them Rev last labs with pt Rev low sat fat diet in detail Labs planned today  Is good  Takes simvasattin 40 mg daily       Relevant Medications   simvastatin (ZOCOR) 40 MG tablet   Other Relevant Orders   Comprehensive metabolic panel   Lipid panel   Colon cancer screening    Pt declines colon cancer  screening of any kind currently       Prediabetes    A1C ordered Very good diet/exercise habits   disc imp of low glycemic diet and wt  loss to prevent DM2       Relevant Orders   Comprehensive metabolic panel   Hemoglobin A1c   Estrogen deficiency   Relevant Orders   DG Bone Density   Welcome to Medicare preventive visit - Primary    Reviewed health habits including diet and exercise and skin cancer prevention Reviewed appropriate screening tests for age  Also reviewed health mt list, fam hx and immunization status , as well as social and family history   See HPI Labs ordered  Pt declines colon cancer screening  Due for diag mammogram- ordered  dexa ordered  utd vaccines incl covid  Interested in shingrix-info given to see if covered No falls  Good health habits  Hearing and vision screens reviewed/good  bp was better on 2nd check today-will watch        Relevant Orders   CBC with Differential/Platelet   TSH   Screening mammogram, encounter for   Relevant Orders   MM DIAG BREAST TOMO BILATERAL   Distortion of contour of breast    Pt had breast distortion 2019 that lead to bx (benign) She missed 6 mo diag mm  Order done  Pt will call to schedule  Also due for screening       Relevant Orders   MM DIAG BREAST TOMO BILATERAL

## 2019-11-24 NOTE — Assessment & Plan Note (Signed)
Pt had breast distortion 2019 that lead to bx (benign) She missed 6 mo diag mm  Order done  Pt will call to schedule  Also due for screening

## 2019-11-24 NOTE — Assessment & Plan Note (Signed)
Disc goals for lipids and reasons to control them Rev last labs with pt Rev low sat fat diet in detail Labs planned today  Is good  Takes simvasattin 40 mg daily

## 2019-11-24 NOTE — Assessment & Plan Note (Signed)
Due for 3 y dexa-ordered and pt will call to schedule No falls or fractures  Taking ca and D Good exercise habits

## 2019-11-24 NOTE — Assessment & Plan Note (Signed)
Reviewed health habits including diet and exercise and skin cancer prevention Reviewed appropriate screening tests for age  Also reviewed health mt list, fam hx and immunization status , as well as social and family history   See HPI Labs ordered  Pt declines colon cancer screening  Due for diag mammogram- ordered  dexa ordered  utd vaccines incl covid  Interested in shingrix-info given to see if covered No falls  Good health habits  Hearing and vision screens reviewed/good  bp was better on 2nd check today-will watch

## 2019-11-24 NOTE — Assessment & Plan Note (Signed)
Pt declines colon cancer screening of any kind currently

## 2019-11-24 NOTE — Patient Instructions (Addendum)
Call and schedule your mammogram and bone density test at [facility]   If you are interested in the new shingles vaccine (Shingrix) - call your local pharmacy to check on coverage and availability  If affordable, get on a wait list at your pharmacy to get the vaccine.    Keep up the good work with diet and exercise

## 2019-11-24 NOTE — Assessment & Plan Note (Signed)
A1C ordered Very good diet/exercise habits   disc imp of low glycemic diet and wt loss to prevent DM2

## 2019-12-17 ENCOUNTER — Other Ambulatory Visit: Payer: Self-pay | Admitting: Family Medicine

## 2019-12-17 ENCOUNTER — Other Ambulatory Visit: Payer: Self-pay

## 2019-12-17 ENCOUNTER — Ambulatory Visit
Admission: RE | Admit: 2019-12-17 | Discharge: 2019-12-17 | Disposition: A | Discharge status: Home / Self Care | Payer: Medicare Other | Source: Ambulatory Visit | Attending: Family Medicine | Admitting: Family Medicine

## 2019-12-17 DIAGNOSIS — R928 Other abnormal and inconclusive findings on diagnostic imaging of breast: Secondary | ICD-10-CM | POA: Diagnosis not present

## 2019-12-17 DIAGNOSIS — N6489 Other specified disorders of breast: Secondary | ICD-10-CM | POA: Diagnosis not present

## 2019-12-17 DIAGNOSIS — M8589 Other specified disorders of bone density and structure, multiple sites: Secondary | ICD-10-CM | POA: Diagnosis not present

## 2019-12-17 DIAGNOSIS — Z1231 Encounter for screening mammogram for malignant neoplasm of breast: Secondary | ICD-10-CM

## 2019-12-17 DIAGNOSIS — E2839 Other primary ovarian failure: Secondary | ICD-10-CM | POA: Insufficient documentation

## 2019-12-17 DIAGNOSIS — Z780 Asymptomatic menopausal state: Secondary | ICD-10-CM | POA: Diagnosis not present

## 2020-10-06 DIAGNOSIS — H2513 Age-related nuclear cataract, bilateral: Secondary | ICD-10-CM | POA: Diagnosis not present

## 2020-10-06 DIAGNOSIS — H25033 Anterior subcapsular polar age-related cataract, bilateral: Secondary | ICD-10-CM | POA: Diagnosis not present

## 2020-10-19 ENCOUNTER — Other Ambulatory Visit: Payer: Self-pay | Admitting: Family Medicine

## 2020-11-17 ENCOUNTER — Other Ambulatory Visit: Payer: Self-pay | Admitting: Family Medicine

## 2020-11-17 DIAGNOSIS — Z1231 Encounter for screening mammogram for malignant neoplasm of breast: Secondary | ICD-10-CM

## 2020-11-28 ENCOUNTER — Other Ambulatory Visit: Payer: Self-pay

## 2020-11-28 ENCOUNTER — Encounter: Payer: Self-pay | Admitting: Family Medicine

## 2020-11-28 ENCOUNTER — Ambulatory Visit (INDEPENDENT_AMBULATORY_CARE_PROVIDER_SITE_OTHER): Payer: Medicare Other | Admitting: Family Medicine

## 2020-11-28 VITALS — BP 136/78 | HR 57 | Temp 97.6°F | Ht 59.5 in | Wt 143.0 lb

## 2020-11-28 DIAGNOSIS — E7800 Pure hypercholesterolemia, unspecified: Secondary | ICD-10-CM

## 2020-11-28 DIAGNOSIS — Z1231 Encounter for screening mammogram for malignant neoplasm of breast: Secondary | ICD-10-CM | POA: Diagnosis not present

## 2020-11-28 DIAGNOSIS — M8589 Other specified disorders of bone density and structure, multiple sites: Secondary | ICD-10-CM | POA: Diagnosis not present

## 2020-11-28 DIAGNOSIS — Z0000 Encounter for general adult medical examination without abnormal findings: Secondary | ICD-10-CM

## 2020-11-28 DIAGNOSIS — R7303 Prediabetes: Secondary | ICD-10-CM | POA: Diagnosis not present

## 2020-11-28 DIAGNOSIS — Z1211 Encounter for screening for malignant neoplasm of colon: Secondary | ICD-10-CM

## 2020-11-28 LAB — COMPREHENSIVE METABOLIC PANEL
ALT: 32 U/L (ref 0–35)
AST: 33 U/L (ref 0–37)
Albumin: 4.6 g/dL (ref 3.5–5.2)
Alkaline Phosphatase: 65 U/L (ref 39–117)
BUN: 12 mg/dL (ref 6–23)
CO2: 26 mEq/L (ref 19–32)
Calcium: 10 mg/dL (ref 8.4–10.5)
Chloride: 103 mEq/L (ref 96–112)
Creatinine, Ser: 0.77 mg/dL (ref 0.40–1.20)
GFR: 79.03 mL/min (ref >60.00)
Glucose, Bld: 91 mg/dL (ref 70–99)
Potassium: 4.5 mEq/L (ref 3.5–5.1)
Sodium: 138 mEq/L (ref 135–145)
Total Bilirubin: 0.7 mg/dL (ref 0.2–1.2)
Total Protein: 7.5 g/dL (ref 6.0–8.3)

## 2020-11-28 LAB — LIPID PANEL
Cholesterol: 209 mg/dL — ABNORMAL HIGH (ref 0–200)
HDL: 85.7 mg/dL (ref >39.00)
LDL Cholesterol: 106 mg/dL — ABNORMAL HIGH (ref 0–99)
NonHDL: 123.08
Total CHOL/HDL Ratio: 2
Triglycerides: 85 mg/dL (ref 0.0–149.0)
VLDL: 17 mg/dL (ref 0.0–40.0)

## 2020-11-28 LAB — CBC WITH DIFFERENTIAL/PLATELET
Basophils Absolute: 0.1 10*3/uL (ref 0.0–0.1)
Basophils Relative: 0.8 % (ref 0.0–3.0)
Eosinophils Absolute: 0.6 10*3/uL (ref 0.0–0.7)
Eosinophils Relative: 8.2 % — ABNORMAL HIGH (ref 0.0–5.0)
HCT: 39.4 % (ref 36.0–46.0)
Hemoglobin: 13.1 g/dL (ref 12.0–15.0)
Lymphocytes Relative: 30.6 % (ref 12.0–46.0)
Lymphs Abs: 2.1 10*3/uL (ref 0.7–4.0)
MCHC: 33.3 g/dL (ref 30.0–36.0)
MCV: 93.4 fl (ref 78.0–100.0)
Monocytes Absolute: 0.5 10*3/uL (ref 0.1–1.0)
Monocytes Relative: 7.4 % (ref 3.0–12.0)
Neutro Abs: 3.6 10*3/uL (ref 1.4–7.7)
Neutrophils Relative %: 53 % (ref 43.0–77.0)
Platelets: 229 10*3/uL (ref 150.0–400.0)
RBC: 4.22 Mil/uL (ref 3.87–5.11)
RDW: 12.3 % (ref 11.5–15.5)
WBC: 6.8 10*3/uL (ref 4.0–10.5)

## 2020-11-28 LAB — TSH: TSH: 0.81 u[IU]/mL (ref 0.35–5.50)

## 2020-11-28 LAB — HEMOGLOBIN A1C: Hgb A1c MFr Bld: 6 % (ref 4.6–6.5)

## 2020-11-28 MED ORDER — SIMVASTATIN 40 MG PO TABS: 40.0000 mg | ORAL_TABLET | Freq: Every day | ORAL | 3 refills | Status: DC

## 2020-11-28 NOTE — Assessment & Plan Note (Signed)
Declines colonoscopy  Options discussed  cologuard ordered

## 2020-11-28 NOTE — Assessment & Plan Note (Signed)
Disc goals for lipids and reasons to control them Rev last labs with pt Rev low sat fat diet in detail Taking simvastatin 40 mg daily  Good diet  Labs drawn

## 2020-11-28 NOTE — Assessment & Plan Note (Signed)
Reviewed health habits including diet and exercise and skin cancer prevention Reviewed appropriate screening tests for age  Also reviewed health mt list, fam hx and immunization status , as well as social and family history   See HPI Labs reviewed  cologuard ordered for screening  Mammogram planned next mo /order done Plans flu shot in the fall  Will call for her shingrix dates dexa utd/no falls or fx and good exercise  Adv directive is utd  No cognitive concerns- very good mem/cognition No problems with ADLs Hearing and vision screen rev, planning cataract surgery PHQ score of 0

## 2020-11-28 NOTE — Assessment & Plan Note (Signed)
Order done for her upcoming mammogram

## 2020-11-28 NOTE — Assessment & Plan Note (Signed)
dexa utd 9/21  No falls or fractures Fall prev discussed  (yoga is great) Taking ca and D Good exercise

## 2020-11-28 NOTE — Assessment & Plan Note (Signed)
A1C today  Diet is good  Exercise is good disc imp of low glycemic diet and wt loss to prevent DM2

## 2020-11-28 NOTE — Progress Notes (Signed)
Subjective:    Patient ID: Kristin Neal, female    DOB: September 18, 1950, 70 y.o.   MRN: [id number]  This visit occurred during the SARS-CoV-2 public health emergency.  Safety protocols were in place, including screening questions prior to the visit, additional usage of staff PPE, and extensive cleaning of exam room while observing appropriate contact time as indicated for disinfecting solutions.   HPI Pt presents for amw and health mt visit   I have personally reviewed the Medicare Annual Wellness questionnaire and have noted 1. The patient's medical and social history 2. Their use of alcohol, tobacco or illicit drugs 3. Their current medications and supplements 4. The patient's functional ability including ADL's, fall risks, home safety risks and hearing or visual             impairment. 5. Diet and physical activities 6. Evidence for depression or mood disorders  The patients weight, height, BMI have been recorded in the chart and visual acuity is per eye clinic.  I have made referrals, counseling and provided education to the patient based review of the above and I have provided the pt with a written personalized care plan for preventive services. Reviewed and updated provider list, see scanned forms.  See scanned forms.  Routine anticipatory guidance given to patient.  See health maintenance. Colon cancer screening colonoscopy 1/04- now wants to do cologuard  Breast cancer screening mammogram 9/21 Self breast exam-no lumps  Flu vaccine- fall Tetanus vaccine Tdap 8/19 [facility]neumovax completed- [facility] Covid immunized Zoster vaccine-has had zostavax, thinks she had shingrix  Dexa 9/21 osteopenia mixed trend Falls- none  Fractures-none Supplements- D3 and calcium  Exercise - walks daily , yoga, weight lifting   Advance directive-up to date  Cognitive function addressed- see scanned forms- and if abnormal then additional documentation follows.   No issues with memory or cognition  Is  able to care for her mother and grandkids  Still does math and balances checkbook  Does not misplace things often  Is careful  No confusion  Does not get lost   PMH and SH reviewed  Meds, vitals, and allergies reviewed.   ROS: See HPI.  Otherwise negative.    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 106 year old mother fell and broke her wrist  Eating very healthy    Hearing/vision: Had eye exam in July/wears glasses , she has a cataract that will need attention  Does not note problems with hearing   Hearing Screening   '500Hz'$  '1000Hz'$  '2000Hz'$  '4000Hz'$   Right ear 40 40 40 40  Left ear 40 40 40 40   Vision Screening   Right eye Left eye Both eyes  Without correction     With correction '20/25 20/30 20/25 '$      PHQ Flowsheet Row Office Visit from 11/28/2020 in [place] at [facility] Total Score 0        ADLs-no problems at all   Care team PCP-Addley Ballinger  Oph- Bullakowski  BP Readings from Last 3 Encounters:  11/28/20 136/78  11/24/19 136/72  11/11/18 126/72   Pulse Readings from Last 3 Encounters:  11/28/20 (!) 57  11/24/19 (!) 59  11/11/18 60    Prediabetes Lab Results  Component Value  Date   HGBA1C 6.0 11/24/2019  Due for labs   Hyperlipidemia Lab Results  Component Value Date   CHOL 205 (H) 11/24/2019   HDL 66.70 11/24/2019   LDLCALC 105 (H) 11/11/2018   LDLDIRECT 107.0 11/24/2019   TRIG 244.0 (H) 11/24/2019   CHOLHDL 3 11/24/2019  Simvastatin 40 mg daily Due for labs   Patient Active Problem List   Diagnosis Date Noted   Medicare annual wellness visit, initial 11/24/2019   Screening mammogram, encounter for 11/24/2019   Estrogen deficiency 08/22/2015   Prediabetes 08/18/2015   Colon cancer screening 04/20/2014   Encounter for routine  gynecological examination 11/27/2010   Routine general medical examination at a health care facility 08/16/2010   Osteopenia 01/19/2008   Hyperlipidemia 06/26/2007   Past Medical History:  Diagnosis Date   Hyperlipidemia    Osteopenia 01/2008   Past Surgical History:  Procedure Laterality Date   APPENDECTOMY  1980   after rupture   BREAST BIOPSY Left 01/16/2018   affirm stereo biopsy/ x marker, PREDOMINANTLY MATURE ADIPOSE TISSUE WITH INTERSPERSED FIBROUS STROMA . NO ATYPIA OR MALIGNANCY   Social History   Tobacco Use   Smoking status: Former    Types: Cigarettes    Quit date: 04/02/1976    Years since quitting: 44.6   Smokeless tobacco: Never  Substance Use Topics   Alcohol use: Yes    Alcohol/week: 0.0 standard drinks    Comment: occasional   Drug use: No   Family History  Problem Relation Age of Onset   Osteoporosis Mother    Hyperlipidemia Father    Hypertension Father    Breast cancer Neg Hx    No Known Allergies Current Outpatient Medications on File Prior to Visit  Medication Sig Dispense Refill   Magnesium 400 MG TABS Take by mouth.     ASHWAGANDHA PO Take 460 mg by mouth daily.      Calcium Carbonate-Vitamin D (CALCIUM-VITAMIN D3 PO) Take 1 capsule by mouth daily. Calcium 1200 mg/ vitamin d3 5000     NON FORMULARY Take 1,000 mg by mouth daily. TRIPHALA     Omega-3 1000 MG CAPS Take 1 capsule by mouth daily.     No current facility-administered medications on file prior to visit.    Review of Systems  Constitutional:  Negative for activity change, appetite change, fatigue, fever and unexpected weight change.  HENT:  Negative for congestion, ear pain, rhinorrhea, sinus pressure and sore throat.   Eyes:  Negative for pain, redness and visual disturbance.  Respiratory:  Negative for cough, shortness of breath and wheezing.   Cardiovascular:  Negative for chest pain and palpitations.  Gastrointestinal:  Negative for abdominal pain, blood in stool, constipation  and diarrhea.  Endocrine: Negative for polydipsia and polyuria.  Genitourinary:  Negative for dysuria, frequency and urgency.  Musculoskeletal:  Negative for arthralgias, back pain and myalgias.  Skin:  Negative for pallor and rash.  Allergic/Immunologic: Negative for environmental allergies.  Neurological:  Negative for dizziness, syncope and headaches.  Hematological:  Negative for adenopathy. Does not bruise/bleed easily.  Psychiatric/Behavioral:  Negative for decreased concentration and dysphoric mood. The patient is not nervous/anxious.       Objective:   Physical Exam Constitutional:      General: She is not in acute distress.    Appearance: Normal appearance. She is well-developed and normal weight. She is not ill-appearing or diaphoretic.  HENT:     Head: Normocephalic and atraumatic.     Right Ear: Tympanic  membrane, ear canal and external ear normal.     Left Ear: Tympanic membrane, ear canal and external ear normal.     Nose: Nose normal. No congestion.     Mouth/Throat:     Mouth: Mucous membranes are moist.     Pharynx: Oropharynx is clear. No posterior oropharyngeal erythema.  Eyes:     General: No scleral icterus.    Extraocular Movements: Extraocular movements intact.     Conjunctiva/sclera: Conjunctivae normal.     Pupils: Pupils are equal, round, and reactive to light.  Neck:     Thyroid: No thyromegaly.     Vascular: No carotid bruit or JVD.  Cardiovascular:     Rate and Rhythm: Normal rate and regular rhythm.     Pulses: Normal pulses.     Heart sounds: Normal heart sounds.    No gallop.  Pulmonary:     Effort: Pulmonary effort is normal. No respiratory distress.     Breath sounds: Normal breath sounds. No wheezing.     Comments: Good air exch Chest:     Chest wall: No tenderness.  Abdominal:     General: Bowel sounds are normal. There is no distension or abdominal bruit.     Palpations: Abdomen is soft. There is no mass.     Tenderness: There is no  abdominal tenderness.     Hernia: No hernia is present.  Genitourinary:    Comments: Breast exam: No mass, nodules, thickening, tenderness, bulging, retraction, inflamation, nipple discharge or skin changes noted.  No axillary or clavicular LA.     Musculoskeletal:        General: No tenderness. Normal range of motion.     Cervical back: Normal range of motion and neck supple. No rigidity. No muscular tenderness.     Right lower leg: No edema.     Left lower leg: No edema.     Comments: Mildly tanned Solar lentigines diffusely  Small flesh colored nevus mid back 3 mm nl shape and symmetric  Lymphadenopathy:     Cervical: No cervical adenopathy.  Skin:    General: Skin is warm and dry.     Coloration: Skin is not pale.     Findings: No erythema or rash.  Neurological:     Mental Status: She is alert. Mental status is at baseline.     Cranial Nerves: No cranial nerve deficit.     Motor: No abnormal muscle tone.     Coordination: Coordination normal.     Gait: Gait normal.     Deep Tendon Reflexes: Reflexes are normal and symmetric. Reflexes normal.  Psychiatric:        Mood and Affect: Mood normal.        Cognition and Memory: Cognition and memory normal.          Assessment & Plan:   Problem List Items Addressed This Visit       Musculoskeletal and Integument   Osteopenia    dexa utd 9/21  No falls or fractures Fall prev discussed  (yoga is great) Taking ca and D Good exercise         Other   Hyperlipidemia    Disc goals for lipids and reasons to control them Rev last labs with pt Rev low sat fat diet in detail Taking simvastatin 40 mg daily  Good diet  Labs drawn      Relevant Medications   simvastatin (ZOCOR) 40 MG tablet   Other Relevant Orders  Lipid panel   Routine general medical examination at a health care facility    Reviewed health habits including diet and exercise and skin cancer prevention Reviewed appropriate screening tests for age   Also reviewed health mt list, fam hx and immunization status , as well as social and family history   See HPI Labs reviewed  cologuard ordered for screening  Mammogram planned next mo /order done Plans flu shot in the fall  Will call for her shingrix dates dexa utd/no falls or fx and good exercise  Adv directive is utd  No cognitive concerns- very good mem/cognition No problems with ADLs Hearing and vision screen rev, planning cataract surgery PHQ score of 0         Relevant Orders   CBC with Differential/Platelet   Comprehensive metabolic panel   TSH   Lipid panel   Colon cancer screening    Declines colonoscopy  Options discussed  cologuard ordered      Relevant Orders   Cologuard   Prediabetes    A1C today  Diet is good  Exercise is good disc imp of low glycemic diet and wt loss to prevent DM2       Relevant Orders   Hemoglobin A1c   Medicare annual wellness visit, initial - Primary    Reviewed health habits including diet and exercise and skin cancer prevention Reviewed appropriate screening tests for age  Also reviewed health mt list, fam hx and immunization status , as well as social and family history   See HPI Labs reviewed  cologuard ordered for screening  Mammogram planned next mo /order done Plans flu shot in the fall  Will call for her shingrix dates dexa utd/no falls or fx and good exercise  Adv directive is utd  No cognitive concerns- very good mem/cognition No problems with ADLs Hearing and vision screen rev, planning cataract surgery PHQ score of 0         Screening mammogram, encounter for    Order done for her upcoming mammogram      Relevant Orders   MM 3D SCREEN BREAST BILATERAL

## 2020-11-28 NOTE — Patient Instructions (Addendum)
I ordered cologuard and a mammogram   Take care of yourself Try to get most of your carbohydrates from produce (with the exception of white potatoes)  Eat less bread/pasta/rice/snack foods/cereals/sweets and other items from the middle of the grocery store (processed carbs) Avoid red meat/ fried foods/ egg yolks/ fatty breakfast meats/ butter, cheese and high fat dairy/ and shellfish

## 2020-11-29 ENCOUNTER — Encounter: Payer: Self-pay | Admitting: Family Medicine

## 2020-11-29 NOTE — Telephone Encounter (Signed)
I checked and it was Terri who drew her labs, just wanted to pass along the message so routed to [place] and [place], Therapist, sports as well

## 2020-12-02 DIAGNOSIS — Z1211 Encounter for screening for malignant neoplasm of colon: Secondary | ICD-10-CM | POA: Diagnosis not present

## 2020-12-08 LAB — COLOGUARD: Cologuard: NEGATIVE

## 2020-12-12 DIAGNOSIS — H25043 Posterior subcapsular polar age-related cataract, bilateral: Secondary | ICD-10-CM | POA: Diagnosis not present

## 2020-12-12 DIAGNOSIS — H2511 Age-related nuclear cataract, right eye: Secondary | ICD-10-CM | POA: Diagnosis not present

## 2020-12-12 DIAGNOSIS — H25013 Cortical age-related cataract, bilateral: Secondary | ICD-10-CM | POA: Diagnosis not present

## 2020-12-12 DIAGNOSIS — H40013 Open angle with borderline findings, low risk, bilateral: Secondary | ICD-10-CM | POA: Diagnosis not present

## 2020-12-12 DIAGNOSIS — H2513 Age-related nuclear cataract, bilateral: Secondary | ICD-10-CM | POA: Diagnosis not present

## 2021-01-19 DIAGNOSIS — H2511 Age-related nuclear cataract, right eye: Secondary | ICD-10-CM | POA: Diagnosis not present

## 2021-01-20 DIAGNOSIS — H25042 Posterior subcapsular polar age-related cataract, left eye: Secondary | ICD-10-CM | POA: Diagnosis not present

## 2021-01-20 DIAGNOSIS — H25012 Cortical age-related cataract, left eye: Secondary | ICD-10-CM | POA: Diagnosis not present

## 2021-01-20 DIAGNOSIS — H2512 Age-related nuclear cataract, left eye: Secondary | ICD-10-CM | POA: Diagnosis not present

## 2021-01-24 ENCOUNTER — Ambulatory Visit
Admission: RE | Admit: 2021-01-24 | Discharge: 2021-01-24 | Disposition: A | Discharge status: Home / Self Care | Payer: Medicare Other | Source: Ambulatory Visit | Attending: Family Medicine | Admitting: Family Medicine

## 2021-01-24 ENCOUNTER — Other Ambulatory Visit: Payer: Self-pay

## 2021-01-24 DIAGNOSIS — Z1231 Encounter for screening mammogram for malignant neoplasm of breast: Secondary | ICD-10-CM | POA: Diagnosis not present

## 2021-02-09 DIAGNOSIS — H2512 Age-related nuclear cataract, left eye: Secondary | ICD-10-CM | POA: Diagnosis not present

## 2021-02-09 DIAGNOSIS — Z961 Presence of intraocular lens: Secondary | ICD-10-CM | POA: Diagnosis not present

## 2021-02-09 DIAGNOSIS — H25032 Anterior subcapsular polar age-related cataract, left eye: Secondary | ICD-10-CM | POA: Diagnosis not present

## 2021-08-03 DIAGNOSIS — D485 Neoplasm of uncertain behavior of skin: Secondary | ICD-10-CM | POA: Diagnosis not present

## 2021-08-03 DIAGNOSIS — D2272 Melanocytic nevi of left lower limb, including hip: Secondary | ICD-10-CM | POA: Diagnosis not present

## 2021-08-03 DIAGNOSIS — L814 Other melanin hyperpigmentation: Secondary | ICD-10-CM | POA: Diagnosis not present

## 2021-08-03 DIAGNOSIS — D2262 Melanocytic nevi of left upper limb, including shoulder: Secondary | ICD-10-CM | POA: Diagnosis not present

## 2021-08-03 DIAGNOSIS — L821 Other seborrheic keratosis: Secondary | ICD-10-CM | POA: Diagnosis not present

## 2021-08-03 DIAGNOSIS — D2261 Melanocytic nevi of right upper limb, including shoulder: Secondary | ICD-10-CM | POA: Diagnosis not present

## 2021-08-03 DIAGNOSIS — D2271 Melanocytic nevi of right lower limb, including hip: Secondary | ICD-10-CM | POA: Diagnosis not present

## 2021-08-03 DIAGNOSIS — D0462 Carcinoma in situ of skin of left upper limb, including shoulder: Secondary | ICD-10-CM | POA: Diagnosis not present

## 2021-08-03 DIAGNOSIS — D225 Melanocytic nevi of trunk: Secondary | ICD-10-CM | POA: Diagnosis not present

## 2021-08-23 DIAGNOSIS — L570 Actinic keratosis: Secondary | ICD-10-CM | POA: Diagnosis not present

## 2021-08-23 DIAGNOSIS — D0462 Carcinoma in situ of skin of left upper limb, including shoulder: Secondary | ICD-10-CM | POA: Diagnosis not present

## 2021-12-11 ENCOUNTER — Encounter: Payer: Self-pay | Admitting: Family Medicine

## 2021-12-11 ENCOUNTER — Ambulatory Visit: Payer: Medicare Other

## 2021-12-11 ENCOUNTER — Ambulatory Visit (INDEPENDENT_AMBULATORY_CARE_PROVIDER_SITE_OTHER): Payer: Medicare Other | Admitting: Family Medicine

## 2021-12-11 VITALS — BP 131/80 | HR 57 | Temp 97.9°F | Ht 59.5 in | Wt 145.1 lb

## 2021-12-11 DIAGNOSIS — Z23 Encounter for immunization: Secondary | ICD-10-CM | POA: Diagnosis not present

## 2021-12-11 DIAGNOSIS — E7800 Pure hypercholesterolemia, unspecified: Secondary | ICD-10-CM | POA: Diagnosis not present

## 2021-12-11 DIAGNOSIS — E2839 Other primary ovarian failure: Secondary | ICD-10-CM

## 2021-12-11 DIAGNOSIS — Z1211 Encounter for screening for malignant neoplasm of colon: Secondary | ICD-10-CM

## 2021-12-11 DIAGNOSIS — R7303 Prediabetes: Secondary | ICD-10-CM

## 2021-12-11 DIAGNOSIS — Z0000 Encounter for general adult medical examination without abnormal findings: Secondary | ICD-10-CM | POA: Diagnosis not present

## 2021-12-11 DIAGNOSIS — M8589 Other specified disorders of bone density and structure, multiple sites: Secondary | ICD-10-CM | POA: Diagnosis not present

## 2021-12-11 DIAGNOSIS — Z1231 Encounter for screening mammogram for malignant neoplasm of breast: Secondary | ICD-10-CM

## 2021-12-11 LAB — COMPREHENSIVE METABOLIC PANEL
ALT: 24 U/L (ref 0–35)
AST: 27 U/L (ref 0–37)
Albumin: 4.5 g/dL (ref 3.5–5.2)
Alkaline Phosphatase: 67 U/L (ref 39–117)
BUN: 13 mg/dL (ref 6–23)
CO2: 27 mEq/L (ref 19–32)
Calcium: 9.8 mg/dL (ref 8.4–10.5)
Chloride: 102 mEq/L (ref 96–112)
Creatinine, Ser: 0.84 mg/dL (ref 0.40–1.20)
GFR: 70.68 mL/min (ref >60.00)
Glucose, Bld: 88 mg/dL (ref 70–99)
Potassium: 4.3 mEq/L (ref 3.5–5.1)
Sodium: 138 mEq/L (ref 135–145)
Total Bilirubin: 0.7 mg/dL (ref 0.2–1.2)
Total Protein: 7.6 g/dL (ref 6.0–8.3)

## 2021-12-11 LAB — TSH: TSH: 0.41 u[IU]/mL (ref 0.35–5.50)

## 2021-12-11 LAB — LIPID PANEL
Cholesterol: 226 mg/dL — ABNORMAL HIGH (ref 0–200)
HDL: 79.9 mg/dL (ref >39.00)
LDL Cholesterol: 121 mg/dL — ABNORMAL HIGH (ref 0–99)
NonHDL: 145.68
Total CHOL/HDL Ratio: 3
Triglycerides: 121 mg/dL (ref 0.0–149.0)
VLDL: 24.2 mg/dL (ref 0.0–40.0)

## 2021-12-11 LAB — CBC WITH DIFFERENTIAL/PLATELET
Basophils Absolute: 0.1 10*3/uL (ref 0.0–0.1)
Basophils Relative: 0.8 % (ref 0.0–3.0)
Eosinophils Absolute: 0.6 10*3/uL (ref 0.0–0.7)
Eosinophils Relative: 7.6 % — ABNORMAL HIGH (ref 0.0–5.0)
HCT: 40.6 % (ref 36.0–46.0)
Hemoglobin: 13.5 g/dL (ref 12.0–15.0)
Lymphocytes Relative: 28.4 % (ref 12.0–46.0)
Lymphs Abs: 2.3 10*3/uL (ref 0.7–4.0)
MCHC: 33.3 g/dL (ref 30.0–36.0)
MCV: 92.6 fl (ref 78.0–100.0)
Monocytes Absolute: 0.5 10*3/uL (ref 0.1–1.0)
Monocytes Relative: 6.2 % (ref 3.0–12.0)
Neutro Abs: 4.6 10*3/uL (ref 1.4–7.7)
Neutrophils Relative %: 57 % (ref 43.0–77.0)
Platelets: 230 10*3/uL (ref 150.0–400.0)
RBC: 4.39 Mil/uL (ref 3.87–5.11)
RDW: 12.5 % (ref 11.5–15.5)
WBC: 8.1 10*3/uL (ref 4.0–10.5)

## 2021-12-11 LAB — HEMOGLOBIN A1C: Hgb A1c MFr Bld: 6.1 % (ref 4.6–6.5)

## 2021-12-11 NOTE — Assessment & Plan Note (Signed)
Mammogram order done for oct for screening  Enc regular self breast exams

## 2021-12-11 NOTE — Assessment & Plan Note (Signed)
cologuard utd from 9/22 Good for 3 y

## 2021-12-11 NOTE — Patient Instructions (Addendum)
If you are interested in the new shingles vaccine (Shingrix) - call your local pharmacy to check on coverage and availability  If affordable, get on a wait list at your pharmacy to get the vaccine.    Call and schedule your mammogram and bone density test at [facility] today  Stay strong! Keep exercising

## 2021-12-11 NOTE — Assessment & Plan Note (Signed)
dexa ordered No falls or fx Good exercise   Disc need for calcium/ vitamin D/ wt bearing exercise and bone density test every 2 y to monitor Disc safety/ fracture risk in detail

## 2021-12-11 NOTE — Assessment & Plan Note (Signed)
Reviewed health habits including diet and exercise and skin cancer prevention Reviewed appropriate screening tests for age  Also reviewed health mt list, fam hx and immunization status , as well as social and family history   See HPI Labs ordered Interested in shingrix and plans to check on coverage  Flu shot given cologuard negative 12/2020 dexa ordered  Mammogram ordered No falls or fractures, good exercise  Encouraged good self care

## 2021-12-11 NOTE — Assessment & Plan Note (Addendum)
a1c ordered  disc imp of low glycemic diet and wt loss to prevent DM2  

## 2021-12-11 NOTE — Progress Notes (Signed)
Subjective:    Patient ID: Kristin Neal, female    DOB: 10-Dec-1950, 71 y.o.   MRN: [id number]  HPI Here for health maintenance exam and to review chronic medical problems    Wt Readings from Last 3 Encounters:  12/11/21 145 lb 2 oz (65.8 kg)  11/28/20 143 lb (64.9 kg)  11/24/19 142 lb 6 oz (64.6 kg)   28.82 kg/m  Caring for her mother at 58- this is hard  She has lymphoma   Intermittent fasting- 15 hours Minda Ditto well for her  Immunization History  Administered Date(s) Administered   Influenza, High Dose Seasonal PF 11/20/2018, 11/23/2019   PFIZER(Purple Top)SARS-COV-2 Vaccination 05/12/2019, 06/08/2019   Pneumococcal Conjugate-13 11/06/2017   Pneumococcal Polysaccharide-23 11/11/2018   Td 01/05/2008   Tdap 11/06/2017   Zoster, Live 12/25/2012   Health Maintenance Due  Topic Date Due   Zoster Vaccines- Shingrix (1 of 2) Never done   COVID-19 Vaccine (3 - Pfizer series) 08/03/2019   INFLUENZA VACCINE  10/31/2021    Shingrix: interested if covered   Wants flu shot today  Colonoscopy 04 Colon screening - cologuard  neg 12/2020   Mammogram 12/2020 Self breast exam- no lumps   Dexa  12/2019-osteopenia   Falls- none  Fractures-none  Supplements ca and D Exercise : walks 3-4 miles per day  Stretch/yoga  Is strong for age - planks and handstands   BP Readings from Last 3 Encounters:  12/11/21 131/80  11/28/20 136/78  11/24/19 136/72    Pulse Readings from Last 3 Encounters:  12/11/21 (!) 57  11/28/20 (!) 57  11/24/19 (!) 59   Hyperlipidemia Lab Results  Component Value Date   CHOL 209 (H) 11/28/2020   CHOL 205 (H) 11/24/2019   CHOL 211 (H) 11/11/2018   Lab Results  Component Value Date   HDL 85.70 11/28/2020   HDL 66.70 11/24/2019   HDL 80 11/11/2018   Lab Results  Component Value Date   LDLCALC 106 (H) 11/28/2020   LDLCALC 105 (H) 11/11/2018   LDLCALC 105 (H) 11/06/2017   Lab Results  Component Value Date   TRIG 85.0 11/28/2020   TRIG  244.0 (H) 11/24/2019   TRIG 131 11/11/2018   Lab Results  Component Value Date   CHOLHDL 2 11/28/2020   CHOLHDL 3 11/24/2019   CHOLHDL 2.6 11/11/2018   Lab Results  Component Value Date   LDLDIRECT 107.0 11/24/2019   Simvastatin 40 mg daily    Prediabetes Lab Results  Component Value Date   HGBA1C 6.0 11/28/2020     Patient Active Problem List   Diagnosis Date Noted   Medicare annual wellness visit, initial 11/24/2019   Screening mammogram, encounter for 11/24/2019   Estrogen deficiency 08/22/2015   Prediabetes 08/18/2015   Colon cancer screening 04/20/2014   Encounter for routine gynecological examination 11/27/2010   Routine general medical examination at a health care facility 08/16/2010   Osteopenia 01/19/2008   Hyperlipidemia 06/26/2007   Past Medical History:  Diagnosis Date   Hyperlipidemia    Osteopenia 01/2008   Past Surgical History:  Procedure Laterality Date   APPENDECTOMY  1980   after rupture   BREAST BIOPSY Left 01/16/2018   affirm stereo biopsy/ x marker, PREDOMINANTLY MATURE ADIPOSE TISSUE WITH INTERSPERSED FIBROUS STROMA . NO ATYPIA OR MALIGNANCY   Social History   Tobacco Use   Smoking status: Former    Types: Cigarettes    Quit date: 04/02/1976    Years since quitting: 79.7  Smokeless tobacco: Never  Substance Use Topics   Alcohol use: Yes    Alcohol/week: 0.0 standard drinks of alcohol    Comment: occasional   Drug use: No   Family History  Problem Relation Age of Onset   Osteoporosis Mother    Hyperlipidemia Father    Hypertension Father    Breast cancer Neg Hx    No Known Allergies Current Outpatient Medications on File Prior to Visit  Medication Sig Dispense Refill   Calcium Carbonate-Vitamin D (CALCIUM-VITAMIN D3 PO) Take 1 capsule by mouth daily. Calcium 1200 mg/ vitamin d3 5000     Magnesium 500 MG CAPS Take 1 capsule by mouth daily.     NON FORMULARY Take 1,000 mg by mouth daily. TRIPHALA     Omega-3 1000 MG CAPS  Take 1 capsule by mouth daily.     simvastatin (ZOCOR) 40 MG tablet Take 1 tablet (40 mg total) by mouth daily. 90 tablet 3   No current facility-administered medications on file prior to visit.    Review of Systems  Constitutional:  Negative for activity change, appetite change, fatigue, fever and unexpected weight change.  HENT:  Negative for congestion, ear pain, rhinorrhea, sinus pressure and sore throat.   Eyes:  Negative for pain, redness and visual disturbance.  Respiratory:  Negative for cough, shortness of breath and wheezing.   Cardiovascular:  Negative for chest pain and palpitations.  Gastrointestinal:  Negative for abdominal pain, blood in stool, constipation and diarrhea.  Endocrine: Negative for polydipsia and polyuria.  Genitourinary:  Negative for dysuria, frequency and urgency.  Musculoskeletal:  Negative for arthralgias, back pain and myalgias.  Skin:  Negative for pallor and rash.  Allergic/Immunologic: Negative for environmental allergies.  Neurological:  Negative for dizziness, syncope and headaches.  Hematological:  Negative for adenopathy. Does not bruise/bleed easily.  Psychiatric/Behavioral:  Negative for decreased concentration and dysphoric mood. The patient is not nervous/anxious.        Objective:   Physical Exam Constitutional:      General: She is not in acute distress.    Appearance: Normal appearance. She is well-developed and normal weight. She is not ill-appearing or diaphoretic.  HENT:     Head: Normocephalic and atraumatic.     Right Ear: Tympanic membrane, ear canal and external ear normal.     Left Ear: Tympanic membrane, ear canal and external ear normal.     Nose: Nose normal. No congestion.     Mouth/Throat:     Mouth: Mucous membranes are moist.     Pharynx: Oropharynx is clear. No posterior oropharyngeal erythema.  Eyes:     General: No scleral icterus.    Extraocular Movements: Extraocular movements intact.     Conjunctiva/sclera:  Conjunctivae normal.     Pupils: Pupils are equal, round, and reactive to light.  Neck:     Thyroid: No thyromegaly.     Vascular: No carotid bruit or JVD.  Cardiovascular:     Rate and Rhythm: Normal rate and regular rhythm.     Pulses: Normal pulses.     Heart sounds: Normal heart sounds.     No gallop.  Pulmonary:     Effort: Pulmonary effort is normal. No respiratory distress.     Breath sounds: Normal breath sounds. No wheezing.     Comments: Good air exch Chest:     Chest wall: No tenderness.  Abdominal:     General: Bowel sounds are normal. There is no distension or abdominal bruit.  Palpations: Abdomen is soft. There is no mass.     Tenderness: There is no abdominal tenderness.     Hernia: No hernia is present.  Genitourinary:    Comments: Breast exam: No mass, nodules, thickening, tenderness, bulging, retraction, inflamation, nipple discharge or skin changes noted.  No axillary or clavicular LA.     Musculoskeletal:        General: No tenderness. Normal range of motion.     Cervical back: Normal range of motion and neck supple. No rigidity. No muscular tenderness.     Right lower leg: No edema.     Left lower leg: No edema.     Comments: No kyphosis   Lymphadenopathy:     Cervical: No cervical adenopathy.  Skin:    General: Skin is warm and dry.     Coloration: Skin is not pale.     Findings: No erythema or rash.     Comments: Solar lentigines diffusely Some sks  Neurological:     Mental Status: She is alert. Mental status is at baseline.     Cranial Nerves: No cranial nerve deficit.     Motor: No abnormal muscle tone.     Coordination: Coordination normal.     Gait: Gait normal.     Deep Tendon Reflexes: Reflexes are normal and symmetric. Reflexes normal.  Psychiatric:        Mood and Affect: Mood normal.        Cognition and Memory: Cognition and memory normal.           Assessment & Plan:   Problem List Items Addressed This Visit        Musculoskeletal and Integument   Osteopenia    dexa ordered No falls or fx Good exercise   Disc need for calcium/ vitamin D/ wt bearing exercise and bone density test every 2 y to monitor Disc safety/ fracture risk in detail          Other   Colon cancer screening    cologuard utd from 9/22 Good for 3 y      Estrogen deficiency   Relevant Orders   DG Bone Density   Hyperlipidemia    Disc goals for lipids and reasons to control them Rev last labs with pt Rev low sat fat diet in detail Lipid panel ordered Plan to continue simvastatin 40 mg daily        Relevant Orders   Lipid panel (Completed)   Comprehensive metabolic panel (Completed)   Hemoglobin A1c (Completed)   Prediabetes    a1c ordered disc imp of low glycemic diet and wt loss to prevent DM2       Routine general medical examination at a health care facility - Primary    Reviewed health habits including diet and exercise and skin cancer prevention Reviewed appropriate screening tests for age  Also reviewed health mt list, fam hx and immunization status , as well as social and family history   See HPI Labs ordered Interested in shingrix and plans to check on coverage  Flu shot given cologuard negative 12/2020 dexa ordered  Mammogram ordered No falls or fractures, good exercise  Encouraged good self care        Relevant Orders   TSH (Completed)   CBC with Differential/Platelet (Completed)   Flu Vaccine QUAD High Dose(Fluad) (Completed)   Screening mammogram, encounter for    Mammogram order done for oct for screening  Enc regular self breast exams  Relevant Orders   MM 3D SCREEN BREAST BILATERAL   Other Visit Diagnoses     Need for influenza vaccination       Relevant Orders   Flu Vaccine QUAD High Dose(Fluad) (Completed)

## 2021-12-11 NOTE — Assessment & Plan Note (Addendum)
Disc goals for lipids and reasons to control them Rev last labs with pt Rev low sat fat diet in detail Lipid panel ordered Plan to continue simvastatin 40 mg daily

## 2021-12-12 ENCOUNTER — Other Ambulatory Visit: Payer: Self-pay | Admitting: Family Medicine

## 2021-12-15 ENCOUNTER — Encounter: Payer: Self-pay | Admitting: Family Medicine

## 2022-02-13 ENCOUNTER — Ambulatory Visit
Admission: RE | Admit: 2022-02-13 | Discharge: 2022-02-13 | Disposition: A | Discharge status: Home / Self Care | Payer: Medicare Other | Source: Ambulatory Visit | Attending: Family Medicine | Admitting: Family Medicine

## 2022-02-13 DIAGNOSIS — M8589 Other specified disorders of bone density and structure, multiple sites: Secondary | ICD-10-CM | POA: Diagnosis not present

## 2022-02-13 DIAGNOSIS — Z780 Asymptomatic menopausal state: Secondary | ICD-10-CM | POA: Diagnosis not present

## 2022-02-13 DIAGNOSIS — E2839 Other primary ovarian failure: Secondary | ICD-10-CM | POA: Insufficient documentation

## 2022-02-13 DIAGNOSIS — Z1231 Encounter for screening mammogram for malignant neoplasm of breast: Secondary | ICD-10-CM

## 2022-03-01 DIAGNOSIS — Z08 Encounter for follow-up examination after completed treatment for malignant neoplasm: Secondary | ICD-10-CM | POA: Diagnosis not present

## 2022-03-01 DIAGNOSIS — L578 Other skin changes due to chronic exposure to nonionizing radiation: Secondary | ICD-10-CM | POA: Diagnosis not present

## 2022-03-01 DIAGNOSIS — L538 Other specified erythematous conditions: Secondary | ICD-10-CM | POA: Diagnosis not present

## 2022-03-01 DIAGNOSIS — Z86007 Personal history of in-situ neoplasm of skin: Secondary | ICD-10-CM | POA: Diagnosis not present

## 2022-03-01 DIAGNOSIS — L814 Other melanin hyperpigmentation: Secondary | ICD-10-CM | POA: Diagnosis not present

## 2022-03-01 DIAGNOSIS — L570 Actinic keratosis: Secondary | ICD-10-CM | POA: Diagnosis not present

## 2022-03-01 DIAGNOSIS — L728 Other follicular cysts of the skin and subcutaneous tissue: Secondary | ICD-10-CM | POA: Diagnosis not present

## 2022-03-01 DIAGNOSIS — L820 Inflamed seborrheic keratosis: Secondary | ICD-10-CM | POA: Diagnosis not present

## 2022-04-12 ENCOUNTER — Telehealth: Payer: Self-pay | Admitting: Family Medicine

## 2022-04-12 NOTE — Telephone Encounter (Signed)
LVM for pt to rtn my call to schedule AWV with [facility] [phone]

## 2022-04-12 NOTE — Telephone Encounter (Signed)
LVM  for pt to rtn my call to schedule AWV with [facility] call back # [phone] 

## 2022-04-16 ENCOUNTER — Telehealth: Payer: Self-pay | Admitting: Family Medicine

## 2022-04-16 NOTE — Telephone Encounter (Signed)
LVM for pt to rtn my call to schedule AWV with [facility] call back # [phone] 

## 2022-08-30 DIAGNOSIS — L578 Other skin changes due to chronic exposure to nonionizing radiation: Secondary | ICD-10-CM | POA: Diagnosis not present

## 2022-08-30 DIAGNOSIS — Z86007 Personal history of in-situ neoplasm of skin: Secondary | ICD-10-CM | POA: Diagnosis not present

## 2022-08-30 DIAGNOSIS — L821 Other seborrheic keratosis: Secondary | ICD-10-CM | POA: Diagnosis not present

## 2022-08-30 DIAGNOSIS — D2261 Melanocytic nevi of right upper limb, including shoulder: Secondary | ICD-10-CM | POA: Diagnosis not present

## 2022-08-30 DIAGNOSIS — D2262 Melanocytic nevi of left upper limb, including shoulder: Secondary | ICD-10-CM | POA: Diagnosis not present

## 2022-08-30 DIAGNOSIS — D2272 Melanocytic nevi of left lower limb, including hip: Secondary | ICD-10-CM | POA: Diagnosis not present

## 2022-08-30 DIAGNOSIS — D2271 Melanocytic nevi of right lower limb, including hip: Secondary | ICD-10-CM | POA: Diagnosis not present

## 2022-08-30 DIAGNOSIS — L814 Other melanin hyperpigmentation: Secondary | ICD-10-CM | POA: Diagnosis not present

## 2022-08-30 DIAGNOSIS — D225 Melanocytic nevi of trunk: Secondary | ICD-10-CM | POA: Diagnosis not present

## 2022-09-27 ENCOUNTER — Other Ambulatory Visit: Payer: Self-pay | Admitting: Family Medicine

## 2022-11-15 ENCOUNTER — Encounter (INDEPENDENT_AMBULATORY_CARE_PROVIDER_SITE_OTHER): Payer: Self-pay

## 2022-12-02 ENCOUNTER — Other Ambulatory Visit: Payer: Self-pay | Admitting: Family Medicine

## 2022-12-06 ENCOUNTER — Ambulatory Visit (INDEPENDENT_AMBULATORY_CARE_PROVIDER_SITE_OTHER): Payer: Medicare Other

## 2022-12-06 VITALS — Ht 59.0 in | Wt 145.0 lb

## 2022-12-06 DIAGNOSIS — Z1231 Encounter for screening mammogram for malignant neoplasm of breast: Secondary | ICD-10-CM

## 2022-12-06 DIAGNOSIS — Z0000 Encounter for general adult medical examination without abnormal findings: Secondary | ICD-10-CM

## 2022-12-06 NOTE — Progress Notes (Signed)
Subjective:   Kristin Neal is a 72 y.o. female who presents for Medicare Annual (Subsequent) preventive examination.  Visit Complete: Virtual  I connected with  Kristin Neal on 12/06/22 by a audio enabled telemedicine application and verified that I am speaking with the correct person using two identifiers.  Patient Location: Home  Provider Location: Office/Clinic  I discussed the limitations of evaluation and management by telemedicine. The patient expressed understanding and agreed to proceed.  Vital Signs: Because this visit was a virtual/telehealth visit, some criteria may be missing or patient reported. Any vitals not documented were not able to be obtained and vitals that have been documented are patient reported.    Review of Systems      Cardiac Risk Factors include: advanced age (>44men, >58 women);dyslipidemia     Objective:    Today's Vitals   12/06/22 0918  Weight: 145 lb (65.8 kg)  Height: 4\' 11"  (1.499 m)   Body mass index is 29.29 kg/m.     12/06/2022    9:23 AM  Advanced Directives  Does Patient Have a Medical Advance Directive? Yes  Type of Estate agent of [place];Living will  Copy of Healthcare Power of Attorney in Chart? No - copy requested    Current Medications (verified) Outpatient Encounter Medications as of 12/06/2022  Medication Sig   Calcium Carbonate-Vitamin D (CALCIUM-VITAMIN D3 PO) Take 1 capsule by mouth daily. Calcium 1200 mg/ vitamin d3 5000   Magnesium 500 MG CAPS Take 1 capsule by mouth daily.   Omega-3 1000 MG CAPS Take 1 capsule by mouth daily.   simvastatin (ZOCOR) 40 MG tablet TAKE 1 TABLET BY MOUTH DAILY   NON FORMULARY Take 1,000 mg by mouth daily. TRIPHALA (Patient not taking: Reported on 12/06/2022)   No facility-administered encounter medications on file as of 12/06/2022.    Allergies (verified) Patient has no known allergies.   History: Past Medical History:  Diagnosis Date   Hyperlipidemia     Osteopenia 01/2008   Past Surgical History:  Procedure Laterality Date   APPENDECTOMY  1980   after rupture   BREAST BIOPSY Left 01/16/2018   affirm stereo biopsy/ x marker, PREDOMINANTLY MATURE ADIPOSE TISSUE WITH INTERSPERSED FIBROUS STROMA . NO ATYPIA OR MALIGNANCY   Family History  Problem Relation Age of Onset   Osteoporosis Mother    Hyperlipidemia Father    Hypertension Father    Breast cancer Neg Hx    Social History   Socioeconomic History   Marital status: Married    Spouse name: Not on file   Number of children: Not on file   Years of education: Not on file   Highest education level: Not on file  Occupational History   Not on file  Tobacco Use   Smoking status: Former    Current packs/day: 0.00    Types: Cigarettes    Quit date: 04/02/1976    Years since quitting: 46.7   Smokeless tobacco: Never  Substance and Sexual Activity   Alcohol use: Yes    Alcohol/week: 0.0 standard drinks of alcohol    Comment: occasional   Drug use: No   Sexual activity: Not on file  Other Topics Concern   Not on file  Social History Narrative   Not on file   Social Determinants of Health   Financial Resource Strain: Low Risk  (12/06/2022)   Overall Financial Resource Strain (CARDIA)    Difficulty of Paying Living Expenses: Not hard at all  Food  Insecurity: No Food Insecurity (12/06/2022)   Hunger Vital Sign    Worried About Running Out of Food in the Last Year: Never true    Ran Out of Food in the Last Year: Never true  Transportation Needs: No Transportation Needs (12/06/2022)   PRAPARE - Administrator, Civil Service (Medical): No    Lack of Transportation (Non-Medical): No  Physical Activity: Sufficiently Active (12/06/2022)   Exercise Vital Sign    Days of Exercise per Week: 7 days    Minutes of Exercise per Session: 90 min  Stress: No Stress Concern Present (12/06/2022)   [employer] of Occupational Health - Occupational Stress Questionnaire    Feeling of  Stress : Not at all  Social Connections: Moderately Integrated (12/06/2022)   Social Connection and Isolation Panel [NHANES]    Frequency of Communication with Friends and Family: More than three times a week    Frequency of Social Gatherings with Friends and Family: More than three times a week    Attends Religious Services: Never    Database administrator or Organizations: Yes    Attends Engineer, structural: More than 4 times per year    Marital Status: Married    Tobacco Counseling Counseling given: Not Answered   Clinical Intake:  Pre-visit preparation completed: Yes  Pain : No/denies pain     BMI - recorded: 29.29 Nutritional Status: BMI 25 -29 Overweight Nutritional Risks: None Diabetes: No  How often do you need to have someone help you when you read instructions, pamphlets, or other written materials from your doctor or pharmacy?: 1 - Never  Interpreter Needed?: No  Information entered by :: Kristin Loftus LPN   Activities of Daily Living    12/06/2022    9:24 AM  In your present state of health, do you have any difficulty performing the following activities:  Hearing? 0  Vision? 0  Difficulty concentrating or making decisions? 0  Walking or climbing stairs? 0  Dressing or bathing? 0  Doing errands, shopping? 0  Preparing Food and eating ? N  Using the Toilet? N  In the past six months, have you accidently leaked urine? N  Do you have problems with loss of bowel control? N  Managing your Medications? N  Managing your Finances? N  Housekeeping or managing your Housekeeping? N    Patient Care Team: Tower, Audrie Gallus, MD as PCP - General  Indicate any recent Medical Services you may have received from other than [facility] providers in the past year (date may be approximate).     Assessment:   This is a routine wellness examination for Kristin Neal.  Hearing/Vision screen Hearing Screening - Comments:: Denies hearing difficulties   Vision Screening -  Comments:: Glasses - [facility] - UTD on eye exams   Goals Addressed             This Visit's Progress    Patient Stated       Maintain current health status.       Depression Screen    12/06/2022    9:21 AM 12/11/2021   11:14 AM 11/28/2020    9:56 AM 11/24/2019   11:41 AM 11/11/2018    3:42 PM 11/06/2017    4:06 PM 10/30/2016    4:03 PM  PHQ 2/9 Scores  PHQ - 2 Score 0 0 0 0 0 0 0  PHQ- 9 Score  0   0  0    Fall Risk  12/06/2022    9:24 AM 11/28/2020    9:56 AM 11/24/2019   11:41 AM 11/11/2018    3:02 PM 11/06/2017    4:06 PM  Fall Risk   Falls in the past year? 0 0 0 0 No  Number falls in past yr: 0 0  0   Injury with Fall? 0 0  0   Risk for fall due to : No Fall Risks      Follow up Falls prevention discussed;Falls evaluation completed  Falls evaluation completed Falls evaluation completed     MEDICARE RISK AT HOME: Medicare Risk at Home Any stairs in or around the home?: Yes If so, are there any without handrails?: No Home free of loose throw rugs in walkways, pet beds, electrical cords, etc?: Yes Adequate lighting in your home to reduce risk of falls?: Yes Life alert?: No Use of a cane, walker or w/c?: No Grab bars in the bathroom?: No Shower chair or bench in shower?: Yes Elevated toilet seat or a handicapped toilet?: Yes  TIMED UP AND GO:  Was the test performed?  No    Cognitive Function:        12/06/2022    9:25 AM  6CIT Screen  What Year? 0 points  What month? 0 points  What time? 0 points  Count back from 20 0 points  Months in reverse 0 points  Repeat phrase 0 points  Total Score 0 points    Immunizations Immunization History  Administered Date(s) Administered   Fluad Quad(high Dose 65+) 12/11/2021   Influenza, High Dose Seasonal PF 11/20/2018, 11/23/2019   PFIZER(Purple Top)SARS-COV-2 Vaccination 05/12/2019, 06/08/2019   Pneumococcal Conjugate-13 11/06/2017   Pneumococcal Polysaccharide-23 11/11/2018   Td 01/05/2008   Tdap  11/06/2017   Zoster Recombinant(Shingrix) 04/16/2018, 09/15/2018   Zoster, Live 12/25/2012    TDAP status: Up to date  Flu Vaccine status: Due, Education has been provided regarding the importance of this vaccine. Advised may receive this vaccine at local pharmacy or Health Dept. Aware to provide a copy of the vaccination record if obtained from local pharmacy or Health Dept. Verbalized acceptance and understanding.  Pneumococcal vaccine status: Up to date  Covid-19 vaccine status: Declined, Education has been provided regarding the importance of this vaccine but patient still declined. Advised may receive this vaccine at local pharmacy or Health Dept.or vaccine clinic. Aware to provide a copy of the vaccination record if obtained from local pharmacy or Health Dept. Verbalized acceptance and understanding.  Qualifies for Shingles Vaccine? Yes   Zostavax completed Yes   Shingrix Completed?: Yes  Screening Tests Health Maintenance  Topic Date Due   INFLUENZA VACCINE  11/01/2022   COVID-19 Vaccine (3 - 2023-24 season) 12/02/2022   Hepatitis C Screening  05/05/2023 (Originally 01/26/1969)   Colonoscopy  11/23/2024 (Originally 04/02/2012)   Medicare Annual Wellness (AWV)  12/06/2023   MAMMOGRAM  02/14/2024   DTaP/Tdap/Td (3 - Td or Tdap) 11/07/2027   Pneumonia Vaccine 34+ Years old  Completed   DEXA SCAN  Completed   Zoster Vaccines- Shingrix  Completed   HPV VACCINES  Aged Out    Health Maintenance  Health Maintenance Due  Topic Date Due   INFLUENZA VACCINE  11/01/2022   COVID-19 Vaccine (3 - 2023-24 season) 12/02/2022    Colorectal cancer screening: Type of screening: Cologuard. Completed 12/02/20. Repeat every 3 years  Mammogram status: Ordered 12/06/22. Pt provided with contact info and advised to call to schedule appt.   Bone Density status:  Completed 02/03/22. Results reflect: Bone density results: OSTEOPENIA. Repeat every 2 years.  Lung Cancer Screening: (Low Dose CT  Chest recommended if Age 56-80 years, 20 pack-year currently smoking OR have quit w/in 15years.) does not qualify.   Lung Cancer Screening Referral:    Additional Screening:  Hepatitis C Screening: does qualify; Completed DUE  Vision Screening: Recommended annual ophthalmology exams for early detection of glaucoma and other disorders of the eye. Is the patient up to date with their annual eye exam?  Yes  Who is the provider or what is the name of the office in which the patient attends annual eye exams? [facility] If pt is not established with a provider, would they like to be referred to a provider to establish care? Yes .   Dental Screening: Recommended annual dental exams for proper oral hygiene  Diabetic Foot Exam:   Community Resource Referral / Chronic Care Management: CRR required this visit?  No   CCM required this visit?  No     Plan:     I have personally reviewed and noted the following in the patient's chart:   Medical and social history Use of alcohol, tobacco or illicit drugs  Current medications and supplements including opioid prescriptions. Patient is not currently taking opioid prescriptions. Functional ability and status Nutritional status Physical activity Advanced directives List of other physicians Hospitalizations, surgeries, and ER visits in previous 12 months Vitals Screenings to include cognitive, depression, and falls Referrals and appointments  In addition, I have reviewed and discussed with patient certain preventive protocols, quality metrics, and best practice recommendations. A written personalized care plan for preventive services as well as general preventive health recommendations were provided to patient.     Maryan Puls, LPN   6/0/6301   After Visit Summary: (MyChart) Due to this being a telephonic visit, the after visit summary with patients personalized plan was offered to patient via MyChart   Nurse Notes:  NONE

## 2022-12-06 NOTE — Patient Instructions (Signed)
Kristin Neal , Thank you for taking time to come for your Medicare Wellness Visit. I appreciate your ongoing commitment to your health goals. Please review the following plan we discussed and let me know if I can assist you in the future.   Referrals/Orders/Follow-Ups/Clinician Recommendations: Aim for 30 minutes of exercise or brisk walking, 6-8 glasses of water, and 5 servings of fruits and vegetables each day.   This is a list of the screening recommended for you and due dates:  Health Maintenance  Topic Date Due   Medicare Annual Wellness Visit  11/28/2021   Flu Shot  11/01/2022   COVID-19 Vaccine (3 - 2023-24 season) 12/02/2022   Hepatitis C Screening  05/05/2023*   Colon Cancer Screening  11/23/2024*   Mammogram  02/14/2024   DTaP/Tdap/Td vaccine (3 - Td or Tdap) 11/07/2027   Pneumonia Vaccine  Completed   DEXA scan (bone density measurement)  Completed   Zoster (Shingles) Vaccine  Completed   HPV Vaccine  Aged Out  *Topic was postponed. The date shown is not the original due date.    Advanced directives: (Copy Requested) Please bring a copy of your health care power of attorney and living will to the office to be added to your chart at your convenience.  Next Medicare Annual Wellness Visit scheduled for next year: Yes

## 2022-12-07 ENCOUNTER — Ambulatory Visit: Payer: Medicare Other

## 2023-01-09 NOTE — Progress Notes (Unsigned)
Subjective:    Patient ID: Kristin Neal, female    DOB: [birth year], 72 y.o.   MRN: [id number]  HPI  Here for health maintenance exam and to review chronic medical problems   Wt Readings from Last 3 Encounters:  01/10/23 147 lb 2 oz (66.7 kg)  12/06/22 145 lb (65.8 kg)  12/11/21 145 lb 2 oz (65.8 kg)   29.22 kg/m  Vitals:   01/10/23 0757 01/10/23 0820  BP: (!) 144/82 132/80  Pulse: (!) 59   Temp: 98.5 F (36.9 C)   SpO2: 97%     Immunization History  Administered Date(s) Administered   Fluad Quad(high Dose 65+) 12/11/2021   Fluad Trivalent(High Dose 65+) 01/10/2023   Influenza, High Dose Seasonal PF 11/20/2018, 11/23/2019   PFIZER(Purple Top)SARS-COV-2 Vaccination 05/12/2019, 06/08/2019   Pneumococcal Conjugate-13 11/06/2017   Pneumococcal Polysaccharide-23 11/11/2018   Td 01/05/2008   Tdap 11/06/2017   Zoster Recombinant(Shingrix) 04/16/2018, 09/15/2018   Zoster, Live 12/25/2012    There are no preventive care reminders to display for this patient.  Doing well   Flu shot - today    Mammogram 01/2022   ordered for this fall Self breast exam- no lumps   Gyn health-no problems   Colon cancer screening -colonoscopy 04/2002 Cologuard neg 12/2020   Bone health  Dexa  01/2022  osteopenia  Falls-none  Fractures-none  Supplements vitamin D 3   Exercise :  Walks a lot = at least 10,000 steps per day Uses weights 2-3 times per week    Dermatology care No recent problems/ goes regularly- yearly now    Mood    01/10/2023    8:15 AM 12/06/2022    9:21 AM 12/11/2021   11:14 AM 11/28/2020    9:56 AM 11/24/2019   11:41 AM  Depression screen PHQ 2/9  Decreased Interest 0 0 0 0 0  Down, Depressed, Hopeless 0 0 0 0 0  PHQ - 2 Score 0 0 0 0 0  Altered sleeping 0  0    Tired, decreased energy 0  0    Change in appetite 0  0    Feeling bad or failure about yourself  0  0    Trouble concentrating 0  0    Moving slowly or fidgety/restless 0  0    Suicidal  thoughts 0  0    PHQ-9 Score 0  0    Difficult doing work/chores Not difficult at all  Not difficult at all     Hyperlipidemia  Lab Results  Component Value Date   CHOL 214 (H) 01/10/2023   HDL 78.80 01/10/2023   LDLCALC 116 (H) 01/10/2023   LDLDIRECT 107.0 11/24/2019   TRIG 100.0 01/10/2023   CHOLHDL 3 01/10/2023   Due for labs   Zocor 40 mg daily   Prediabetes Lab Results  Component Value Date   HGBA1C 6.0 01/10/2023    Other labs  Lab Results  Component Value Date   NA 138 01/10/2023   K 4.6 01/10/2023   CO2 29 01/10/2023   GLUCOSE 92 01/10/2023   BUN 15 01/10/2023   CREATININE 0.73 01/10/2023   CALCIUM 10.0 01/10/2023   GFR 83.01 01/10/2023   GFRNONAA 82 11/11/2018   Lab Results  Component Value Date   ALT 22 01/10/2023   AST 20 01/10/2023   ALKPHOS 59 01/10/2023   BILITOT 0.6 01/10/2023   Lab Results  Component Value Date   TSH 0.84 01/10/2023   Lab Results  Component  Value Date   WBC 8.1 12/11/2021   HGB 13.5 12/11/2021   HCT 40.6 12/11/2021   MCV 92.6 12/11/2021   PLT 230.0 12/11/2021    2-3 glasses of wine daily  Patient Active Problem List   Diagnosis Date Noted   Medicare annual wellness visit, initial 11/24/2019   Screening mammogram, encounter for 11/24/2019   Estrogen deficiency 08/22/2015   Prediabetes 08/18/2015   Colon cancer screening 04/20/2014   Encounter for routine gynecological examination 11/27/2010   Routine general medical examination at a health care facility 08/16/2010   Osteopenia 01/19/2008   Hyperlipidemia 06/26/2007   Past Medical History:  Diagnosis Date   Hyperlipidemia    Osteopenia 01/2008   Past Surgical History:  Procedure Laterality Date   APPENDECTOMY  1980   after rupture   BREAST BIOPSY Left 01/16/2018   affirm stereo biopsy/ x marker, PREDOMINANTLY MATURE ADIPOSE TISSUE WITH INTERSPERSED FIBROUS STROMA . NO ATYPIA OR MALIGNANCY   Social History   Tobacco Use   Smoking status: Former     Current packs/day: 0.00    Types: Cigarettes    Quit date: 04/02/1976    Years since quitting: 46.8   Smokeless tobacco: Never  Substance Use Topics   Alcohol use: Yes    Alcohol/week: 0.0 standard drinks of alcohol    Comment: occasional   Drug use: No   Family History  Problem Relation Age of Onset   Osteoporosis Mother    Hyperlipidemia Father    Hypertension Father    Breast cancer Neg Hx    No Known Allergies Current Outpatient Medications on File Prior to Visit  Medication Sig Dispense Refill   Calcium Carbonate-Vitamin D (CALCIUM-VITAMIN D3 PO) Take 1 capsule by mouth daily. Calcium 1200 mg/ vitamin d3 5000     Magnesium 500 MG CAPS Take 1 capsule by mouth daily.     NON FORMULARY Take 1,000 mg by mouth daily. TRIPHALA     Omega-3 1000 MG CAPS Take 1 capsule by mouth daily.     simvastatin (ZOCOR) 40 MG tablet TAKE 1 TABLET BY MOUTH DAILY 90 tablet 0   No current facility-administered medications on file prior to visit.    Review of Systems  Constitutional:  Negative for activity change, appetite change, fatigue, fever and unexpected weight change.  HENT:  Negative for congestion, ear pain, rhinorrhea, sinus pressure and sore throat.   Eyes:  Negative for pain, redness and visual disturbance.  Respiratory:  Negative for cough, shortness of breath and wheezing.   Cardiovascular:  Negative for chest pain and palpitations.  Gastrointestinal:  Negative for abdominal pain, blood in stool, constipation and diarrhea.  Endocrine: Negative for polydipsia and polyuria.  Genitourinary:  Negative for dysuria, frequency and urgency.  Musculoskeletal:  Negative for arthralgias, back pain and myalgias.  Skin:  Negative for pallor and rash.  Allergic/Immunologic: Negative for environmental allergies.  Neurological:  Negative for dizziness, syncope and headaches.  Hematological:  Negative for adenopathy. Does not bruise/bleed easily.  Psychiatric/Behavioral:  Negative for decreased  concentration and dysphoric mood. The patient is not nervous/anxious.        Objective:   Physical Exam Constitutional:      General: She is not in acute distress.    Appearance: Normal appearance. She is well-developed. She is obese. She is not ill-appearing or diaphoretic.  HENT:     Head: Normocephalic and atraumatic.     Right Ear: Tympanic membrane, ear canal and external ear normal.  Left Ear: Tympanic membrane, ear canal and external ear normal.     Nose: Nose normal. No congestion.     Mouth/Throat:     Mouth: Mucous membranes are moist.     Pharynx: Oropharynx is clear. No posterior oropharyngeal erythema.  Eyes:     General: No scleral icterus.    Extraocular Movements: Extraocular movements intact.     Conjunctiva/sclera: Conjunctivae normal.     Pupils: Pupils are equal, round, and reactive to light.  Neck:     Thyroid: No thyromegaly.     Vascular: No carotid bruit or JVD.  Cardiovascular:     Rate and Rhythm: Normal rate and regular rhythm.     Pulses: Normal pulses.     Heart sounds: Normal heart sounds.     No gallop.  Pulmonary:     Effort: Pulmonary effort is normal. No respiratory distress.     Breath sounds: Normal breath sounds. No wheezing.     Comments: Good air exch Chest:     Chest wall: No tenderness.  Abdominal:     General: Bowel sounds are normal. There is no distension or abdominal bruit.     Palpations: Abdomen is soft. There is no mass.     Tenderness: There is no abdominal tenderness.     Hernia: No hernia is present.  Genitourinary:    Comments: Breast exam: No mass, nodules, thickening, tenderness, bulging, retraction, inflamation, nipple discharge or skin changes noted.  No axillary or clavicular LA.     Musculoskeletal:        General: No tenderness. Normal range of motion.     Cervical back: Normal range of motion and neck supple. No rigidity. No muscular tenderness.     Right lower leg: No edema.     Left lower leg: No edema.      Comments: No kyphosis   Lymphadenopathy:     Cervical: No cervical adenopathy.  Skin:    General: Skin is warm and dry.     Coloration: Skin is not pale.     Findings: No erythema or rash.     Comments: Solar lentigines diffusely   Neurological:     Mental Status: She is alert. Mental status is at baseline.     Cranial Nerves: No cranial nerve deficit.     Motor: No abnormal muscle tone.     Coordination: Coordination normal.     Gait: Gait normal.     Deep Tendon Reflexes: Reflexes are normal and symmetric. Reflexes normal.  Psychiatric:        Mood and Affect: Mood normal.        Cognition and Memory: Cognition and memory normal.           Assessment & Plan:   Problem List Items Addressed This Visit       Musculoskeletal and Integument   Osteopenia    Dexa 01/2022 utd Discussed fall prevention, supplements and exercise for bone density  No falls or fracture this year           Other   Colon cancer screening    Cologuard neg 12/2020        Hyperlipidemia    Disc goals for lipids and reasons to control them Rev last labs with pt Rev low sat fat diet in detail  Lab today  Taking simvastatin 40 mg daily      Relevant Orders   TSH (Completed)   Lipid panel (Completed)   Comprehensive metabolic panel (Completed)  Prediabetes    A1c today disc imp of low glycemic diet and wt loss to prevent DM2       Relevant Orders   Hemoglobin A1c (Completed)   Routine general medical examination at a health care facility - Primary    Reviewed health habits including diet and exercise and skin cancer prevention Reviewed appropriate screening tests for age  Also reviewed health mt list, fam hx and immunization status , as well as social and family history   See HPI Labs reviewed and ordered Flu shot given  Mammogram is ordered  Cologuard utd 12/2020  Dexa utd 01/2022 Discussed fall prevention, supplements and exercise for bone density  Utd derm care PHQ)        Relevant Orders   Flu Vaccine Trivalent High Dose (Fluad) (Completed)   Other Visit Diagnoses     Need for influenza vaccination       Relevant Orders   Flu Vaccine Trivalent High Dose (Fluad) (Completed)

## 2023-01-10 ENCOUNTER — Encounter: Payer: Self-pay | Admitting: Family Medicine

## 2023-01-10 ENCOUNTER — Ambulatory Visit: Payer: Medicare Other | Admitting: Family Medicine

## 2023-01-10 VITALS — BP 132/80 | HR 59 | Temp 98.5°F | Ht 59.5 in | Wt 147.1 lb

## 2023-01-10 DIAGNOSIS — Z23 Encounter for immunization: Secondary | ICD-10-CM | POA: Diagnosis not present

## 2023-01-10 DIAGNOSIS — R7303 Prediabetes: Secondary | ICD-10-CM | POA: Diagnosis not present

## 2023-01-10 DIAGNOSIS — E7800 Pure hypercholesterolemia, unspecified: Secondary | ICD-10-CM

## 2023-01-10 DIAGNOSIS — Z0000 Encounter for general adult medical examination without abnormal findings: Secondary | ICD-10-CM

## 2023-01-10 DIAGNOSIS — M8589 Other specified disorders of bone density and structure, multiple sites: Secondary | ICD-10-CM | POA: Diagnosis not present

## 2023-01-10 DIAGNOSIS — Z1211 Encounter for screening for malignant neoplasm of colon: Secondary | ICD-10-CM

## 2023-01-10 LAB — COMPREHENSIVE METABOLIC PANEL
ALT: 22 U/L (ref 0–35)
AST: 20 U/L (ref 0–37)
Albumin: 4.6 g/dL (ref 3.5–5.2)
Alkaline Phosphatase: 59 U/L (ref 39–117)
BUN: 15 mg/dL (ref 6–23)
CO2: 29 meq/L (ref 19–32)
Calcium: 10 mg/dL (ref 8.4–10.5)
Chloride: 103 meq/L (ref 96–112)
Creatinine, Ser: 0.73 mg/dL (ref 0.40–1.20)
GFR: 83.01 mL/min (ref >60.00)
Glucose, Bld: 92 mg/dL (ref 70–99)
Potassium: 4.6 meq/L (ref 3.5–5.1)
Sodium: 138 meq/L (ref 135–145)
Total Bilirubin: 0.6 mg/dL (ref 0.2–1.2)
Total Protein: 6.9 g/dL (ref 6.0–8.3)

## 2023-01-10 LAB — TSH: TSH: 0.84 u[IU]/mL (ref 0.35–5.50)

## 2023-01-10 LAB — LIPID PANEL
Cholesterol: 214 mg/dL — ABNORMAL HIGH (ref 0–200)
HDL: 78.8 mg/dL (ref >39.00)
LDL Cholesterol: 116 mg/dL — ABNORMAL HIGH (ref 0–99)
NonHDL: 135.6
Total CHOL/HDL Ratio: 3
Triglycerides: 100 mg/dL (ref 0.0–149.0)
VLDL: 20 mg/dL (ref 0.0–40.0)

## 2023-01-10 LAB — HEMOGLOBIN A1C: Hgb A1c MFr Bld: 6 % (ref 4.6–6.5)

## 2023-01-10 NOTE — Assessment & Plan Note (Signed)
A1c today disc imp of low glycemic diet and wt loss to prevent DM2  

## 2023-01-10 NOTE — Assessment & Plan Note (Signed)
Cologuard neg 12/2020

## 2023-01-10 NOTE — Assessment & Plan Note (Addendum)
Dexa 01/2022 utd Discussed fall prevention, supplements and exercise for bone density  No falls or fracture this year

## 2023-01-10 NOTE — Patient Instructions (Addendum)
Keep walking  Add some strength training to your routine, this is important for bone and brain health and can reduce your risk of falls and help your body use insulin properly and regulate weight  Light weights, exercise bands , and internet videos are a good way to start  Yoga (chair or regular), machines , floor exercises or a gym with machines are also good options      You have an order for:  []   2D Mammogram  [x]   3D Mammogram  []   Bone Density     Please call for appointment:   [x]   [facility]  [address] [place] [address]  [phone]  []   [facility])   [address]  [place], [place] [address]  [phone]  []   [facility]      [address] [place], [place]        [phone]         []   [facility]  [address] [place], [place]  [phone]  []  [facility]   [address]   [place], [place] [address]  [phone]  []  [facility]  [address] [place], [place] [address] [phone]    Make sure to wear two piece clothing  No lotions powders or deodorants the day of the appointment Make sure to bring picture ID and insurance card.  Bring list of medications you are currently taking including any supplements.   Schedule your screening mammogram through MyChart!   Select [place] imaging sites can now be scheduled through MyChart.  Log into your MyChart account.  Go to 'Visit' (or 'Appointments' if  on mobile App) --> Schedule an  Appointment  Under 'Select a Reason for Visit' choose the Mammogram  Screening option.  Complete the pre-visit questions  and select the time and place that  best fits your schedule       Mediterranean Diet  Why follow it? Research shows. Those  who follow the Mediterranean diet have a reduced risk of heart disease  The diet is associated with a reduced incidence of Parkinson's and Alzheimer's diseases People following the diet may have longer life expectancies and lower rates of chronic diseases  The Dietary Guidelines for Americans recommends the Mediterranean diet as an eating plan to promote health and prevent disease  What Is the Mediterranean Diet?  Healthy eating plan based on typical foods and recipes of Mediterranean-style cooking The diet is primarily a plant based diet; these foods should make up a majority of meals   Starches - Plant based foods should make up a majority of meals - They are an important sources of vitamins, minerals, energy, antioxidants, and fiber - Choose whole grains, foods high in fiber and minimally processed items  - Typical grain sources include wheat, oats, barley, corn, brown rice, bulgar, farro, millet, polenta, couscous  - Various types of beans include chickpeas, lentils, fava beans, black beans, white beans   Fruits  Veggies - Large quantities of antioxidant rich fruits & veggies; 6 or more servings  - Vegetables can be eaten raw or lightly drizzled with oil and cooked  - Vegetables common to the traditional Mediterranean Diet include: artichokes, arugula, beets, broccoli,  brussel sprouts, cabbage, carrots, celery, collard greens, cucumbers, eggplant, kale, leeks, lemons, lettuce, mushrooms, okra, onions, peas, peppers, potatoes, pumpkin, radishes, rutabaga, shallots, spinach, sweet potatoes, turnips, zucchini - Fruits common to the Mediterranean Diet include: apples, apricots, avocados, cherries, clementines, dates, figs, grapefruits, grapes, melons, nectarines, oranges, peaches, pears, pomegranates, strawberries, tangerines  Fats - Replace butter and margarine with healthy oils, such as olive oil, canola oil, and tahini  - Limit nuts to no more than a handful a day  - Nuts include walnuts,  almonds, pecans, pistachios, pine nuts  - Limit or avoid candied, honey roasted or heavily salted nuts - Olives are central to the [employer] - can be eaten whole or used in a variety of dishes   Meats Protein - Limiting red meat: no more than a few times a month - When eating red meat: choose lean cuts and keep the portion to the size of deck of cards - Eggs: approx. 0 to 4 times a week  - Fish and lean poultry: at least 2 a week  - Healthy protein sources include, chicken, [place], lean beef, lamb - Increase intake of seafood such as tuna, salmon, trout, mackerel, shrimp, scallops - Avoid or limit high fat processed meats such as sausage and bacon  Dairy - Include moderate amounts of low fat dairy products  - Focus on healthy dairy such as fat free yogurt, skim milk, low or reduced fat cheese - Limit dairy products higher in fat such as whole or 2% milk, cheese, ice cream  Alcohol - Moderate amounts of red wine is ok  - No more than 5 oz daily for women (all ages) and men older than age 21  - No more than 10 oz of wine daily for men younger than 72  Other - Limit sweets and other desserts  - Use herbs and spices instead of salt to flavor foods  - Herbs and spices common to the traditional Mediterranean Diet include: basil, bay leaves, chives, cloves, cumin, fennel, garlic, lavender, marjoram, mint, oregano, parsley, pepper, rosemary, sage, savory, sumac, tarragon, thyme   It's not just a diet, it's a lifestyle:  The Mediterranean diet includes lifestyle factors typical of those in the region  Foods, drinks and meals are best eaten with others and savored Daily physical activity is important for overall good health This could be strenuous exercise like running and aerobics This could also be more leisurely activities such as walking, housework, yard-work, or taking the stairs Moderation is the key; a balanced and healthy diet accommodates most foods and drinks Consider portion  sizes and frequency of consumption of certain foods   Meal Ideas & Options:  Breakfast:  Whole wheat toast or whole wheat English muffins with peanut butter & hard boiled egg Steel cut oats topped with apples & cinnamon and skim milk  Fresh fruit: banana, strawberries, melon, berries, peaches  Smoothies: strawberries, bananas, greek yogurt, peanut butter Low fat greek yogurt with blueberries and granola  Egg white omelet with spinach and mushrooms Breakfast couscous: whole wheat couscous, apricots, skim milk, cranberries  Sandwiches:  Hummus and grilled vegetables (peppers, zucchini, squash) on whole wheat bread   Grilled chicken on whole wheat pita with lettuce, tomatoes, cucumbers or tzatziki  [place] salad on whole wheat bread: tuna salad made with greek yogurt, olives, red peppers, capers, green onions Garlic rosemary lamb pita: lamb sauted with garlic, rosemary, salt & pepper; add lettuce, cucumber, greek yogurt to pita - flavor with lemon juice and  black pepper  Seafood:  Mediterranean grilled salmon, seasoned with garlic, basil, parsley, lemon juice and black pepper Shrimp, lemon, and spinach whole-grain pasta salad made with low fat greek yogurt  Seared scallops with lemon orzo  Seared tuna steaks seasoned salt, pepper, coriander topped with tomato mixture of olives, tomatoes, olive oil, minced garlic, parsley, green onions and cappers  Meats:  Herbed greek chicken salad with kalamata olives, cucumber, feta  Red bell peppers stuffed with spinach, bulgur, lean ground beef (or lentils) & topped with feta   Kebabs: skewers of chicken, tomatoes, onions, zucchini, squash  [place] burgers: made with red onions, mint, dill, lemon juice, feta cheese topped with roasted red peppers Vegetarian Cucumber salad: cucumbers, artichoke hearts, celery, red onion, feta cheese, tossed in olive oil & lemon juice  Hummus and whole grain pita points with a greek salad (lettuce, tomato, feta, olives,  cucumbers, red onion) Lentil soup with celery, carrots made with vegetable broth, garlic, salt and pepper  Tabouli salad: parsley, bulgur, mint, scallions, cucumbers, tomato, radishes, lemon juice, olive oil, salt and pepper.

## 2023-01-10 NOTE — Assessment & Plan Note (Signed)
Reviewed health habits including diet and exercise and skin cancer prevention Reviewed appropriate screening tests for age  Also reviewed health mt list, fam hx and immunization status , as well as social and family history   See HPI Labs reviewed and ordered Flu shot given  Mammogram is ordered  Cologuard utd 12/2020  Dexa utd 01/2022 Discussed fall prevention, supplements and exercise for bone density  Utd derm care PHQ)

## 2023-01-10 NOTE — Assessment & Plan Note (Signed)
Disc goals for lipids and reasons to control them Rev last labs with pt Rev low sat fat diet in detail  Lab today  Taking simvastatin 40 mg daily

## 2023-02-08 ENCOUNTER — Other Ambulatory Visit: Payer: Self-pay | Admitting: Family Medicine

## 2023-02-19 ENCOUNTER — Ambulatory Visit
Admission: RE | Admit: 2023-02-19 | Discharge: 2023-02-19 | Disposition: A | Discharge status: Home / Self Care | Payer: Medicare Other | Source: Ambulatory Visit | Attending: Family Medicine | Admitting: Family Medicine

## 2023-02-19 DIAGNOSIS — H52223 Regular astigmatism, bilateral: Secondary | ICD-10-CM | POA: Diagnosis not present

## 2023-02-19 DIAGNOSIS — D3131 Benign neoplasm of right choroid: Secondary | ICD-10-CM | POA: Diagnosis not present

## 2023-02-19 DIAGNOSIS — Z1231 Encounter for screening mammogram for malignant neoplasm of breast: Secondary | ICD-10-CM | POA: Insufficient documentation

## 2023-02-19 DIAGNOSIS — Z9842 Cataract extraction status, left eye: Secondary | ICD-10-CM | POA: Diagnosis not present

## 2023-02-19 DIAGNOSIS — Z9841 Cataract extraction status, right eye: Secondary | ICD-10-CM | POA: Diagnosis not present

## 2023-05-17 ENCOUNTER — Telehealth: Payer: Self-pay | Admitting: *Deleted

## 2023-05-17 NOTE — Telephone Encounter (Signed)
Copied from CRM [id number]. Topic: Clinical - Medication Question >> May 17, 2023  1:54 PM Alcus Dad H wrote: Reason for CRM: Patient is wanting to know if Dr. Milinda Antis can call her a prescription for tamiflu in to the [facility] on [facility] because as of yesterday, she started having flu symptoms. She has not tested positive anywhere

## 2023-05-17 NOTE — Telephone Encounter (Signed)
Left message to return call to our office.

## 2023-05-17 NOTE — Telephone Encounter (Signed)
Please triage, can't send med without an appt anyway

## 2023-05-20 NOTE — Telephone Encounter (Signed)
 Copied from CRM [id number]. Topic: Clinical - Medical Advice >> May 17, 2023  4:14 PM Isabell A wrote: Reason for CRM: Patient returning phone call from [place], [place] advised to send CRM for a call back.

## 2023-05-20 NOTE — Telephone Encounter (Signed)
 I spoke with pt; pt said was exposed to flu by pts daughter. Pt did not go to UC; pt said she feels 100% better today and declined appt at [facility].sending note to Dr Milinda Antis  and [employer].

## 2023-06-13 DIAGNOSIS — Z1211 Encounter for screening for malignant neoplasm of colon: Secondary | ICD-10-CM | POA: Diagnosis not present

## 2023-06-13 DIAGNOSIS — Z1212 Encounter for screening for malignant neoplasm of rectum: Secondary | ICD-10-CM | POA: Diagnosis not present

## 2023-06-18 LAB — EXTERNAL GENERIC LAB PROCEDURE: COLOGUARD: NEGATIVE

## 2023-06-18 LAB — COLOGUARD: COLOGUARD: NEGATIVE

## 2023-06-19 ENCOUNTER — Encounter: Payer: Self-pay | Admitting: Family Medicine

## 2023-06-19 LAB — LAB REPORT - SCANNED: Cologuard: NEGATIVE

## 2023-07-26 ENCOUNTER — Other Ambulatory Visit: Payer: Self-pay | Admitting: Family Medicine

## 2023-09-30 ENCOUNTER — Other Ambulatory Visit: Payer: Self-pay | Admitting: Family Medicine

## 2023-10-30 DIAGNOSIS — Z08 Encounter for follow-up examination after completed treatment for malignant neoplasm: Secondary | ICD-10-CM | POA: Diagnosis not present

## 2023-10-30 DIAGNOSIS — D2271 Melanocytic nevi of right lower limb, including hip: Secondary | ICD-10-CM | POA: Diagnosis not present

## 2023-10-30 DIAGNOSIS — D2261 Melanocytic nevi of right upper limb, including shoulder: Secondary | ICD-10-CM | POA: Diagnosis not present

## 2023-10-30 DIAGNOSIS — D485 Neoplasm of uncertain behavior of skin: Secondary | ICD-10-CM | POA: Diagnosis not present

## 2023-10-30 DIAGNOSIS — L821 Other seborrheic keratosis: Secondary | ICD-10-CM | POA: Diagnosis not present

## 2023-10-30 DIAGNOSIS — D2262 Melanocytic nevi of left upper limb, including shoulder: Secondary | ICD-10-CM | POA: Diagnosis not present

## 2023-10-30 DIAGNOSIS — Z85828 Personal history of other malignant neoplasm of skin: Secondary | ICD-10-CM | POA: Diagnosis not present

## 2023-10-30 DIAGNOSIS — L905 Scar conditions and fibrosis of skin: Secondary | ICD-10-CM | POA: Diagnosis not present

## 2023-10-30 DIAGNOSIS — D2272 Melanocytic nevi of left lower limb, including hip: Secondary | ICD-10-CM | POA: Diagnosis not present

## 2023-10-30 DIAGNOSIS — D225 Melanocytic nevi of trunk: Secondary | ICD-10-CM | POA: Diagnosis not present

## 2023-12-08 ENCOUNTER — Other Ambulatory Visit: Payer: Self-pay | Admitting: Family Medicine

## 2023-12-16 ENCOUNTER — Ambulatory Visit (INDEPENDENT_AMBULATORY_CARE_PROVIDER_SITE_OTHER): Payer: Medicare Other

## 2023-12-16 VITALS — BP 132/80 | Ht 59.5 in | Wt 145.0 lb

## 2023-12-16 DIAGNOSIS — Z0000 Encounter for general adult medical examination without abnormal findings: Secondary | ICD-10-CM

## 2023-12-16 NOTE — Progress Notes (Signed)
 Because this visit was a virtual/telehealth visit,  certain criteria was not obtained, such a blood pressure, CBG if applicable, and timed get up and go. Any medications not marked as taking were not mentioned during the medication reconciliation part of the visit. Any vitals not documented were not able to be obtained due to this being a telehealth visit or patient was unable to self-report a recent blood pressure reading due to a lack of equipment at home via telehealth. Vitals that have been documented are verbally provided by the patient.  This visit was performed by a medical professional under my direct supervision. I was immediately available for consultation/collaboration. I have reviewed and agree with the Annual Wellness Visit documentation.  Subjective:   Kristin Neal is a 73 y.o. who presents for a Medicare Wellness preventive visit.  As a reminder, Annual Wellness Visits don't include a physical exam, and some assessments may be limited, especially if this visit is performed virtually. We may recommend an in-person follow-up visit with your provider if needed.  Visit Complete: Virtual I connected with  Kristin Neal on 12/16/23 by a audio enabled telemedicine application and verified that I am speaking with the correct person using two identifiers.  Patient Location: Home  Provider Location: Home Office  I discussed the limitations of evaluation and management by telemedicine. The patient expressed understanding and agreed to proceed.  Vital Signs: Because this visit was a virtual/telehealth visit, some criteria may be missing or patient reported. Any vitals not documented were not able to be obtained and vitals that have been documented are patient reported.  VideoDeclined- This patient declined Librarian, academic. Therefore the visit was completed with audio only.  Persons Participating in Visit: Patient.  AWV Questionnaire: No: Patient Medicare AWV  questionnaire was not completed prior to this visit.  Cardiac Risk Factors include: advanced age (>62men, >87 women);dyslipidemia     Objective:    Today's Vitals   12/16/23 1028  BP: 132/80  Weight: 145 lb (65.8 kg)  Height: 4' 11.5 (1.511 m)   Body mass index is 28.8 kg/m.     12/16/2023   10:28 AM 12/06/2022    9:23 AM  Advanced Directives  Does Patient Have a Medical Advance Directive? No Yes  Type of Special educational needs teacher of [place];Living will  Copy of Healthcare Power of Attorney in Chart?  No - copy requested  Would patient like information on creating a medical advance directive? No - Patient declined     Current Medications (verified) Outpatient Encounter Medications as of 12/16/2023  Medication Sig   Calcium Carbonate-Vitamin D  (CALCIUM-VITAMIN D3 PO) Take 1 capsule by mouth daily. Calcium 1200 mg/ vitamin d3 5000   Magnesium 500 MG CAPS Take 1 capsule by mouth daily.   NON FORMULARY Take 1,000 mg by mouth daily. TRIPHALA   Omega-3 1000 MG CAPS Take 1 capsule by mouth daily.   simvastatin  (ZOCOR ) 40 MG tablet TAKE 1 TABLET BY MOUTH DAILY   No facility-administered encounter medications on file as of 12/16/2023.    Allergies (verified) Patient has no known allergies.   History: Past Medical History:  Diagnosis Date   Hyperlipidemia    Osteopenia 01/2008   Past Surgical History:  Procedure Laterality Date   APPENDECTOMY  1980   after rupture   BREAST BIOPSY Left 01/16/2018   affirm stereo biopsy/ x marker, PREDOMINANTLY MATURE ADIPOSE TISSUE WITH INTERSPERSED FIBROUS STROMA . NO ATYPIA OR MALIGNANCY   Family History  Problem Relation Age of Onset   Osteoporosis Mother    Hyperlipidemia Father    Hypertension Father    Breast cancer Neg Hx    Social History   Socioeconomic History   Marital status: Married    Spouse name: Not on file   Number of children: Not on file   Years of education: Not on file   Highest education level:  Not on file  Occupational History   Not on file  Tobacco Use   Smoking status: Former    Current packs/day: 0.00    Types: Cigarettes    Quit date: 04/02/1976    Years since quitting: 47.7   Smokeless tobacco: Never  Substance and Sexual Activity   Alcohol use: Yes    Alcohol/week: 0.0 standard drinks of alcohol    Comment: occasional   Drug use: No   Sexual activity: Not on file  Other Topics Concern   Not on file  Social History Narrative   Not on file   Social Drivers of Health   Financial Resource Strain: Low Risk  (12/16/2023)   Overall Financial Resource Strain (CARDIA)    Difficulty of Paying Living Expenses: Not hard at all  Food Insecurity: No Food Insecurity (12/16/2023)   Hunger Vital Sign    Worried About Running Out of Food in the Last Year: Never true    Ran Out of Food in the Last Year: Never true  Transportation Needs: No Transportation Needs (12/16/2023)   PRAPARE - Administrator, Civil Service (Medical): No    Lack of Transportation (Non-Medical): No  Physical Activity: Sufficiently Active (12/16/2023)   Exercise Vital Sign    Days of Exercise per Week: 7 days    Minutes of Exercise per Session: 60 min  Stress: No Stress Concern Present (12/16/2023)   [employer] of Occupational Health - Occupational Stress Questionnaire    Feeling of Stress: Not at all  Social Connections: Moderately Integrated (12/16/2023)   Social Connection and Isolation Panel    Frequency of Communication with Friends and Family: More than three times a week    Frequency of Social Gatherings with Friends and Family: More than three times a week    Attends Religious Services: Never    Database administrator or Organizations: Yes    Attends Engineer, structural: More than 4 times per year    Marital Status: Married    Tobacco Counseling Counseling given: Not Answered    Clinical Intake:  Pre-visit preparation completed: Yes  Pain : No/denies pain      BMI - recorded: 28.8 Nutritional Status: BMI 25 -29 Overweight Nutritional Risks: None Diabetes: No  Lab Results  Component Value Date   HGBA1C 6.0 01/10/2023   HGBA1C 6.1 12/11/2021   HGBA1C 6.0 11/28/2020     How often do you need to have someone help you when you read instructions, pamphlets, or other written materials from your doctor or pharmacy?: 1 - Never  Interpreter Needed?: No  Information entered by :: Khaylee Mcevoy,CMA   Activities of Daily Living     12/16/2023   10:30 AM  In your present state of health, do you have any difficulty performing the following activities:  Hearing? 0  Vision? 0  Difficulty concentrating or making decisions? 0  Walking or climbing stairs? 0  Dressing or bathing? 0  Doing errands, shopping? 0  Preparing Food and eating ? N  Using the Toilet? N  In the past six  months, have you accidently leaked urine? N  Do you have problems with loss of bowel control? N  Managing your Medications? N  Managing your Finances? N  Housekeeping or managing your Housekeeping? N    Patient Care Team: [place], Laine LABOR, MD as PCP - General  I have updated your Care Teams any recent Medical Services you may have received from other providers in the past year.     Assessment:   This is a routine wellness examination for Kristin Neal.  Hearing/Vision screen Hearing Screening - Comments:: No difficulties  Vision Screening - Comments:: Patient wears glasses    Goals Addressed             This Visit's Progress    Patient Stated       To be the best grandmother in th       Depression Screen     12/16/2023   10:31 AM 01/10/2023    8:15 AM 12/06/2022    9:21 AM 12/11/2021   11:14 AM 11/28/2020    9:56 AM 11/24/2019   11:41 AM 11/11/2018    3:42 PM  PHQ 2/9 Scores  PHQ - 2 Score 0 0 0 0 0 0 0  PHQ- 9 Score 0 0  0   0    Fall Risk     12/16/2023   10:30 AM 01/10/2023    8:15 AM 12/06/2022    9:24 AM 11/28/2020    9:56 AM 11/24/2019   11:41  AM  Fall Risk   Falls in the past year? 0 0 0 0 0  Number falls in past yr: 0 0 0 0   Injury with Fall? 0 0 0 0   Risk for fall due to : No Fall Risks No Fall Risks No Fall Risks    Follow up Falls evaluation completed Falls evaluation completed Falls prevention discussed;Falls evaluation completed  Falls evaluation completed      Data saved with a previous flowsheet row definition    MEDICARE RISK AT HOME:  Medicare Risk at Home Any stairs in or around the home?: Yes If so, are there any without handrails?: Yes Home free of loose throw rugs in walkways, pet beds, electrical cords, etc?: Yes Adequate lighting in your home to reduce risk of falls?: Yes Life alert?: No Use of a cane, walker or w/c?: No Grab bars in the bathroom?: Yes Shower chair or bench in shower?: Yes Elevated toilet seat or a handicapped toilet?: Yes  TIMED UP AND GO:  Was the test performed?  No  Cognitive Function: 6CIT completed        12/16/2023   10:32 AM 12/06/2022    9:25 AM  6CIT Screen  What Year? 0 points 0 points  What month? 0 points 0 points  What time? 0 points 0 points  Count back from 20 0 points 0 points  Months in reverse 0 points 0 points  Repeat phrase 0 points 0 points  Total Score 0 points 0 points    Immunizations Immunization History  Administered Date(s) Administered   Fluad Quad(high Dose 65+) 12/11/2021   Fluad Trivalent(High Dose 65+) 01/10/2023   INFLUENZA, HIGH DOSE SEASONAL PF 11/20/2018, 11/23/2019   PFIZER(Purple Top)SARS-COV-2 Vaccination 05/12/2019, 06/08/2019   Pneumococcal Conjugate-13 11/06/2017   Pneumococcal Polysaccharide-23 11/11/2018   Td 01/05/2008   Tdap 11/06/2017   Zoster Recombinant(Shingrix) 04/16/2018, 09/15/2018   Zoster, Live 12/25/2012    Screening Tests Health Maintenance  Topic Date Due   Hepatitis C  Screening  Never done   Influenza Vaccine  11/01/2023   COVID-19 Vaccine (3 - 2025-26 season) 12/02/2023   Colonoscopy  11/23/2024  (Originally 04/02/2012)   Medicare Annual Wellness (AWV)  12/15/2024   Mammogram  02/18/2025   DTaP/Tdap/Td (3 - Td or Tdap) 11/07/2027   Pneumococcal Vaccine: 50+ Years  Completed   DEXA SCAN  Completed   Zoster Vaccines- Shingrix  Completed   HPV VACCINES  Aged Out   Meningococcal B Vaccine  Aged Out    Health Maintenance Items Addressed:patient declined for the moment  Additional Screening:  Vision Screening: Recommended annual ophthalmology exams for early detection of glaucoma and other disorders of the eye. Is the patient up to date with their annual eye exam?  Yes  Who is the provider or what is the name of the office in which the patient attends annual eye exams?   Dental Screening: Recommended annual dental exams for proper oral hygiene  Community Resource Referral / Chronic Care Management: CRR required this visit?  No   CCM required this visit?  No   Plan:    I have personally reviewed and noted the following in the patient's chart:   Medical and social history Use of alcohol, tobacco or illicit drugs  Current medications and supplements including opioid prescriptions. Patient is not currently taking opioid prescriptions. Functional ability and status Nutritional status Physical activity Advanced directives List of other physicians Hospitalizations, surgeries, and ER visits in previous 12 months Vitals Screenings to include cognitive, depression, and falls Referrals and appointments  In addition, I have reviewed and discussed with patient certain preventive protocols, quality metrics, and best practice recommendations. A written personalized care plan for preventive services as well as general preventive health recommendations were provided to patient.   Lyle MARLA Right, [place]   12/16/2023   After Visit Summary: (MyChart) Due to this being a telephonic visit, the after visit summary with patients personalized plan was offered to patient via MyChart    Notes: Nothing significant to report at this time.

## 2023-12-16 NOTE — Patient Instructions (Signed)
 Kristin Neal,  Thank you for taking the time for your Medicare Wellness Visit. I appreciate your continued commitment to your health goals. Please review the care plan we discussed, and feel free to reach out if I can assist you further.  Medicare recommends these wellness visits once per year to help you and your care team stay ahead of potential health issues. These visits are designed to focus on prevention, allowing your provider to concentrate on managing your acute and chronic conditions during your regular appointments.  Please note that Annual Wellness Visits do not include a physical exam. Some assessments may be limited, especially if the visit was conducted virtually. If needed, we may recommend a separate in-person follow-up with your provider.  Ongoing Care Seeing your primary care provider every 3 to 6 months helps us  monitor your health and provide consistent, personalized care.   Referrals If a referral was made during today's visit and you haven't received any updates within two weeks, please contact the referred provider directly to check on the status.  Recommended Screenings:  Health Maintenance  Topic Date Due   Hepatitis C Screening  Never done   Flu Shot  11/01/2023   COVID-19 Vaccine (3 - 2025-26 season) 12/02/2023   Colon Cancer Screening  11/23/2024*   Medicare Annual Wellness Visit  12/15/2024   Breast Cancer Screening  02/18/2025   DTaP/Tdap/Td vaccine (3 - Td or Tdap) 11/07/2027   Pneumococcal Vaccine for age over 33  Completed   DEXA scan (bone density measurement)  Completed   Zoster (Shingles) Vaccine  Completed   HPV Vaccine  Aged Out   Meningitis B Vaccine  Aged Out  *Topic was postponed. The date shown is not the original due date.       12/16/2023   10:28 AM  Advanced Directives  Does Patient Have a Medical Advance Directive? No  Would patient like information on creating a medical advance directive? No - Patient declined   Advance Care Planning  is important because it: Ensures you receive medical care that aligns with your values, goals, and preferences. Provides guidance to your family and loved ones, reducing the emotional burden of decision-making during critical moments.  Vision: Annual vision screenings are recommended for early detection of glaucoma, cataracts, and diabetic retinopathy. These exams can also reveal signs of chronic conditions such as diabetes and high blood pressure.  Dental: Annual dental screenings help detect early signs of oral cancer, gum disease, and other conditions linked to overall health, including heart disease and diabetes.  Please see the attached documents for additional preventive care recommendations.

## 2023-12-25 ENCOUNTER — Other Ambulatory Visit: Payer: Self-pay | Admitting: Family Medicine

## 2024-01-05 ENCOUNTER — Telehealth: Payer: Self-pay | Admitting: Family Medicine

## 2024-01-05 DIAGNOSIS — E7800 Pure hypercholesterolemia, unspecified: Secondary | ICD-10-CM

## 2024-01-05 DIAGNOSIS — R7303 Prediabetes: Secondary | ICD-10-CM

## 2024-01-05 NOTE — Telephone Encounter (Signed)
-----   Message from Veva JINNY Ferrari sent at 12/26/2023  2:44 PM EDT ----- Regarding: Lab orders for Tue, 10.7.25 Patient is scheduled for CPX labs, please order future labs, Thanks , Veva

## 2024-01-07 ENCOUNTER — Other Ambulatory Visit (INDEPENDENT_AMBULATORY_CARE_PROVIDER_SITE_OTHER): Payer: Medicare Other

## 2024-01-07 ENCOUNTER — Ambulatory Visit: Payer: Self-pay | Admitting: Family Medicine

## 2024-01-07 DIAGNOSIS — E7800 Pure hypercholesterolemia, unspecified: Secondary | ICD-10-CM | POA: Diagnosis not present

## 2024-01-07 DIAGNOSIS — R7303 Prediabetes: Secondary | ICD-10-CM | POA: Diagnosis not present

## 2024-01-07 LAB — LIPID PANEL
Cholesterol: 225 mg/dL — ABNORMAL HIGH (ref 0–200)
HDL: 70.5 mg/dL (ref >39.00)
LDL Cholesterol: 132 mg/dL — ABNORMAL HIGH (ref 0–99)
NonHDL: 154.51
Total CHOL/HDL Ratio: 3
Triglycerides: 113 mg/dL (ref 0.0–149.0)
VLDL: 22.6 mg/dL (ref 0.0–40.0)

## 2024-01-07 LAB — COMPREHENSIVE METABOLIC PANEL WITH GFR
ALT: 20 U/L (ref 0–35)
AST: 22 U/L (ref 0–37)
Albumin: 4.5 g/dL (ref 3.5–5.2)
Alkaline Phosphatase: 57 U/L (ref 39–117)
BUN: 18 mg/dL (ref 6–23)
CO2: 27 meq/L (ref 19–32)
Calcium: 9.7 mg/dL (ref 8.4–10.5)
Chloride: 102 meq/L (ref 96–112)
Creatinine, Ser: 0.66 mg/dL (ref 0.40–1.20)
GFR: 87.93 mL/min (ref >60.00)
Glucose, Bld: 102 mg/dL — ABNORMAL HIGH (ref 70–99)
Potassium: 5.2 meq/L — ABNORMAL HIGH (ref 3.5–5.1)
Sodium: 136 meq/L (ref 135–145)
Total Bilirubin: 0.6 mg/dL (ref 0.2–1.2)
Total Protein: 7.2 g/dL (ref 6.0–8.3)

## 2024-01-07 LAB — TSH: TSH: 0.75 u[IU]/mL (ref 0.35–5.50)

## 2024-01-07 LAB — HEMOGLOBIN A1C: Hgb A1c MFr Bld: 6.2 % (ref 4.6–6.5)

## 2024-01-14 ENCOUNTER — Ambulatory Visit: Payer: Self-pay | Admitting: Family Medicine

## 2024-01-14 ENCOUNTER — Encounter: Payer: Self-pay | Admitting: Family Medicine

## 2024-01-14 ENCOUNTER — Ambulatory Visit: Payer: Medicare Other | Admitting: Family Medicine

## 2024-01-14 VITALS — BP 134/76 | HR 65 | Temp 98.0°F | Ht 59.25 in | Wt 147.0 lb

## 2024-01-14 DIAGNOSIS — E875 Hyperkalemia: Secondary | ICD-10-CM | POA: Diagnosis not present

## 2024-01-14 DIAGNOSIS — Z23 Encounter for immunization: Secondary | ICD-10-CM | POA: Diagnosis not present

## 2024-01-14 DIAGNOSIS — R7303 Prediabetes: Secondary | ICD-10-CM

## 2024-01-14 DIAGNOSIS — K1379 Other lesions of oral mucosa: Secondary | ICD-10-CM | POA: Insufficient documentation

## 2024-01-14 DIAGNOSIS — Z1231 Encounter for screening mammogram for malignant neoplasm of breast: Secondary | ICD-10-CM | POA: Diagnosis not present

## 2024-01-14 DIAGNOSIS — E7800 Pure hypercholesterolemia, unspecified: Secondary | ICD-10-CM

## 2024-01-14 DIAGNOSIS — Z1211 Encounter for screening for malignant neoplasm of colon: Secondary | ICD-10-CM

## 2024-01-14 DIAGNOSIS — M8589 Other specified disorders of bone density and structure, multiple sites: Secondary | ICD-10-CM | POA: Diagnosis not present

## 2024-01-14 DIAGNOSIS — E2839 Other primary ovarian failure: Secondary | ICD-10-CM

## 2024-01-14 DIAGNOSIS — Z0000 Encounter for general adult medical examination without abnormal findings: Secondary | ICD-10-CM | POA: Diagnosis not present

## 2024-01-14 LAB — BASIC METABOLIC PANEL WITH GFR
BUN: 17 mg/dL (ref 6–23)
CO2: 28 meq/L (ref 19–32)
Calcium: 10 mg/dL (ref 8.4–10.5)
Chloride: 102 meq/L (ref 96–112)
Creatinine, Ser: 0.85 mg/dL (ref 0.40–1.20)
GFR: 68.67 mL/min (ref >60.00)
Glucose, Bld: 92 mg/dL (ref 70–99)
Potassium: 4.4 meq/L (ref 3.5–5.1)
Sodium: 138 meq/L (ref 135–145)

## 2024-01-14 MED ORDER — ROSUVASTATIN CALCIUM 10 MG PO TABS: 10.0000 mg | ORAL_TABLET | Freq: Every day | ORAL | 3 refills | Status: AC

## 2024-01-14 NOTE — Assessment & Plan Note (Signed)
 Dexa ordered

## 2024-01-14 NOTE — Patient Instructions (Addendum)
 Change your cholesterol medicine from simvastatin  to rosuvastatin (crestor) 10 mg  Schedule fasting labs in 6 weeks    We need to re check potassium today   Continue great diet/exercise Avoid added sugars in your diet when you can  Try to get most of your carbohydrates from produce (with the exception of white potatoes) and whole grains Eat less bread/pasta/rice/snack foods/cereals/sweets and other items from the middle of the grocery store (processed carbs)  Get lots of lean protein   Keep us  posted re: the mouth symptoms    You have an order for:  [x]   3D Mammogram  [x]   Bone Density     Please call for appointment:   [x]   [facility]  [address] [place] [address]  [phone]  []   [facility])   [address]  [place], [place] [address]  [phone]  []   [facility]      [address] [place], [place]        [phone]         []   [facility]  [address] [place], [place]  [phone]  []  [facility]   [address]   [place], [place] [address]  [phone]  []  [facility]  [address] [place], [place] [address] [phone]    Make sure to wear two piece clothing  No lotions powders or deodorants the day of the appointment Make sure to bring picture ID and insurance card.  Bring list of medications you are currently taking including any supplements.   Schedule your screening mammogram through MyChart!   Select [place] imaging sites can now be scheduled through MyChart.  Log into your MyChart account.  Go to 'Visit' (or 'Appointments' if  on mobile App) --> Schedule an  Appointment  Under 'Select a Reason for Visit' choose the Mammogram  Screening option.  Complete the  pre-visit questions  and select the time and place that  best fits your schedule

## 2024-01-14 NOTE — Assessment & Plan Note (Signed)
 Prediabetes  Lab Results  Component Value Date   HGBA1C 6.2 01/07/2024   HGBA1C 6.0 01/10/2023   HGBA1C 6.1 12/11/2021    disc imp of low glycemic diet and wt loss to prevent DM2

## 2024-01-14 NOTE — Progress Notes (Signed)
 Subjective:    Patient ID: Kristin Neal, female    DOB: 1950/09/01, 73 y.o.   MRN: [id number]  HPI  Here for health maintenance exam and to review chronic medical problems   Wt Readings from Last 3 Encounters:  01/14/24 147 lb (66.7 kg)  12/16/23 145 lb (65.8 kg)  01/10/23 147 lb 2 oz (66.7 kg)   29.44 kg/m  Vitals:   01/14/24 0858  BP: 134/76  Pulse: 65  Temp: 98 F (36.7 C)  SpO2: 99%    Immunization History  Administered Date(s) Administered   Fluad Quad(high Dose 65+) 12/11/2021   Fluad Trivalent(High Dose 65+) 01/10/2023   INFLUENZA, HIGH DOSE SEASONAL PF 11/20/2018, 11/23/2019   PFIZER(Purple Top)SARS-COV-2 Vaccination 05/12/2019, 06/08/2019   Pneumococcal Conjugate-13 11/06/2017   Pneumococcal Polysaccharide-23 11/11/2018   Td 01/05/2008   Tdap 11/06/2017   Zoster Recombinant(Shingrix) 04/16/2018, 09/15/2018   Zoster, Live 12/25/2012    Health Maintenance Due  Topic Date Due   Hepatitis C Screening  Never done   Influenza Vaccine  11/01/2023   Doing well  Retired  Helping care 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/ has another one on the way  Learning [place]   Flu shot-today  Mammogram 02/2023 Self breast exam- no lumps   Gyn health No problems   Colon cancer screening  Colonoscopy 2004 Cologuard neg 06/2023   Bone health  Dexa  01/2022 osteopenia  Falls-none Fractures-none  Supplements - D3  Eats lots of low fat dairy  Last vitamin D  Lab Results  Component Value Date   VD25OH 34.7 11/11/2018    Exercise  Very active  Walks  Weights  Planks/floor work  Also outdoors a lot   Drinks wine daily   2 glasses   Mood    01/14/2024    9:03 AM 12/16/2023   10:31 AM 01/10/2023    8:15 AM 12/06/2022    9:21 AM 12/11/2021   11:14 AM  Depression screen PHQ 2/9  Decreased Interest 0 0 0 0 0  Down, Depressed, Hopeless 0 0 0 0 0  PHQ - 2 Score 0 0 0 0 0  Altered sleeping 0 0 0  0  Tired, decreased energy 0 0 0  0  Change in appetite 0 0 0  0   Feeling bad or failure about yourself  0 0 0  0  Trouble concentrating 0 0 0  0  Moving slowly or fidgety/restless 0 0 0  0  Suicidal thoughts 0 0 0  0  PHQ-9 Score 0 0 0  0  Difficult doing work/chores Not difficult at all Not difficult at all Not difficult at all  Not difficult at all    Hyperlipidemia Lab Results  Component Value Date   CHOL 225 (H) 01/07/2024   CHOL 214 (H) 01/10/2023   CHOL 226 (H) 12/11/2021   Lab Results  Component Value Date   HDL 70.50 01/07/2024   HDL 78.80 01/10/2023   HDL 79.90 12/11/2021   Lab Results  Component Value Date   LDLCALC 132 (H) 01/07/2024   LDLCALC 116 (H) 01/10/2023   LDLCALC 121 (H) 12/11/2021   Lab Results  Component Value Date   TRIG 113.0 01/07/2024   TRIG 100.0 01/10/2023   TRIG 121.0 12/11/2021   Lab Results  Component Value Date   CHOLHDL 3 01/07/2024   CHOLHDL 3 01/10/2023   CHOLHDL 3 12/11/2021   Lab Results  Component Value Date   LDLDIRECT 107.0 11/24/2019   Simvastatin  40 mg  daily  Diet- is good    The 10-year ASCVD risk score (Arnett DK, et al., 2019) is: 11.4%   Values used to calculate the score:     Age: 85 years     Clincally relevant sex: Female     Is Non-Hispanic African American: No     Diabetic: No     Tobacco smoker: No     Systolic Blood Pressure: 134 mmHg     Is BP treated: No     HDL Cholesterol: 70.5 mg/dL     Total Cholesterol: 225 mg/dL    Prediabetes Lab Results  Component Value Date   HGBA1C 6.2 01/07/2024   HGBA1C 6.0 01/10/2023   HGBA1C 6.1 12/11/2021   Tries to stay away from sweets    Lab Results  Component Value Date   NA 136 01/07/2024   K 5.2 (H) 01/07/2024   CO2 27 01/07/2024   GLUCOSE 102 (H) 01/07/2024   BUN 18 01/07/2024   CREATININE 0.66 01/07/2024   CALCIUM 9.7 01/07/2024   GFR 87.93 01/07/2024   GFRNONAA 82 11/11/2018   Lab Results  Component Value Date   ALT 20 01/07/2024   AST 22 01/07/2024   ALKPHOS 57 01/07/2024   BILITOT 0.6 01/07/2024     Lab Results  Component Value Date   TSH 0.75 01/07/2024        Patient Active Problem List   Diagnosis Date Noted   Hyperkalemia 01/14/2024   Mouth pain 01/14/2024   Screening mammogram, encounter for 11/24/2019   Estrogen deficiency 08/22/2015   Prediabetes 08/18/2015   Colon cancer screening 04/20/2014   Encounter for routine gynecological examination 11/27/2010   Routine general medical examination at a health care facility 08/16/2010   Osteopenia 01/19/2008   Hyperlipidemia 06/26/2007   Past Medical History:  Diagnosis Date   Cancer [facility])    Skin removed   Cataract    Surgery both eyes   Hyperlipidemia    Osteopenia 01/01/2008   Past Surgical History:  Procedure Laterality Date   APPENDECTOMY  1980   after rupture   BREAST BIOPSY Left 01/16/2018   affirm stereo biopsy/ x marker, PREDOMINANTLY MATURE ADIPOSE TISSUE WITH INTERSPERSED FIBROUS STROMA . NO ATYPIA OR MALIGNANCY   CESAREAN SECTION  1989/1997   EYE SURGERY     [place]   Social History   Tobacco Use   Smoking status: Former    Current packs/day: 0.00    Types: Cigarettes    Quit date: 04/02/1976    Years since quitting: 47.8   Smokeless tobacco: Never  Substance Use Topics   Alcohol use: Yes    Alcohol/week: 0.0 standard drinks of alcohol    Comment: occasional   Drug use: No   Family History  Problem Relation Age of Onset   Osteoporosis Mother    Hyperlipidemia Father    Hypertension Father    Heart disease Father    Cancer Brother    Breast cancer Neg Hx    No Known Allergies Current Outpatient Medications on File Prior to Visit  Medication Sig Dispense Refill   Calcium Carbonate-Vitamin D  (CALCIUM-VITAMIN D3 PO) Take 1 capsule by mouth daily. Calcium 1200 mg/ vitamin d3 5000     Magnesium 500 MG CAPS Take 1 capsule by mouth daily.     Omega-3 1000 MG CAPS Take 1 capsule by mouth daily.     No current facility-administered medications on file prior to visit.    Review of  Systems  Constitutional:  Negative for activity change, appetite change, fatigue, fever and unexpected weight change.  HENT:  Negative for congestion, ear pain, rhinorrhea, sinus pressure and sore throat.        Some burning in mouth and irritation of lips on/off ? If an allergy   Eyes:  Negative for pain, redness and visual disturbance.  Respiratory:  Negative for cough, shortness of breath and wheezing.   Cardiovascular:  Negative for chest pain and palpitations.  Gastrointestinal:  Negative for abdominal pain, blood in stool, constipation and diarrhea.  Endocrine: Negative for polydipsia and polyuria.  Genitourinary:  Negative for dysuria, frequency and urgency.  Musculoskeletal:  Negative for arthralgias, back pain and myalgias.  Skin:  Negative for pallor and rash.  Allergic/Immunologic: Negative for environmental allergies.  Neurological:  Negative for dizziness, syncope and headaches.  Hematological:  Negative for adenopathy. Does not bruise/bleed easily.  Psychiatric/Behavioral:  Negative for decreased concentration and dysphoric mood. The patient is not nervous/anxious.        Objective:   Physical Exam Constitutional:      General: She is not in acute distress.    Appearance: Normal appearance. She is well-developed and normal weight. She is not ill-appearing or diaphoretic.  HENT:     Head: Normocephalic and atraumatic.     Right Ear: Tympanic membrane, ear canal and external ear normal.     Left Ear: Tympanic membrane, ear canal and external ear normal.     Nose: Nose normal. No congestion.     Mouth/Throat:     Mouth: Mucous membranes are moist.     Pharynx: Oropharynx is clear. No oropharyngeal exudate or posterior oropharyngeal erythema.     Comments: No abnormalities in mouth   Lips are mildly dry  Eyes:     General: No scleral icterus.    Extraocular Movements: Extraocular movements intact.     Conjunctiva/sclera: Conjunctivae normal.     Pupils: Pupils are  equal, round, and reactive to light.  Neck:     Thyroid : No thyromegaly.     Vascular: No carotid bruit or JVD.  Cardiovascular:     Rate and Rhythm: Normal rate and regular rhythm.     Pulses: Normal pulses.     Heart sounds: Normal heart sounds.     No gallop.  Pulmonary:     Effort: Pulmonary effort is normal. No respiratory distress.     Breath sounds: Normal breath sounds. No wheezing.     Comments: Good air exch Chest:     Chest wall: No tenderness.  Abdominal:     General: Bowel sounds are normal. There is no distension or abdominal bruit.     Palpations: Abdomen is soft. There is no mass.     Tenderness: There is no abdominal tenderness.     Hernia: No hernia is present.  Genitourinary:    Comments: Breast exam: No mass, nodules, thickening, tenderness, bulging, retraction, inflamation, nipple discharge or skin changes noted.  No axillary or clavicular LA.     Musculoskeletal:        General: No tenderness. Normal range of motion.     Cervical back: Normal range of motion and neck supple. No rigidity. No muscular tenderness.     Right lower leg: No edema.     Left lower leg: No edema.     Comments: No kyphosis   Lymphadenopathy:     Cervical: No cervical adenopathy.  Skin:    General: Skin is warm and dry.  Coloration: Skin is not pale.     Findings: No erythema or rash.     Comments: Solar lentigines diffusely   Neurological:     Mental Status: She is alert. Mental status is at baseline.     Cranial Nerves: No cranial nerve deficit.     Motor: No abnormal muscle tone.     Coordination: Coordination normal.     Gait: Gait normal.     Deep Tendon Reflexes: Reflexes are normal and symmetric. Reflexes normal.  Psychiatric:        Mood and Affect: Mood normal.        Cognition and Memory: Cognition and memory normal.           Assessment & Plan:   Problem List Items Addressed This Visit       Musculoskeletal and Integument   Osteopenia   No falls or  fractures  Dexa ordered Discussed fall prevention, supplements and exercise for bone density          Other   Screening mammogram, encounter for   Mammo ordered Pt will call to schedule       Relevant Orders   MM 3D SCREENING MAMMOGRAM BILATERAL BREAST   Routine general medical examination at a health care facility - Primary   Reviewed health habits including diet and exercise and skin cancer prevention Reviewed appropriate screening tests for age  Also reviewed health mt list, fam hx and immunization status , as well as social and family history   See HPI Labs reviewed and ordered Health Maintenance  Topic Date Due   Hepatitis C Screening  Never done   Flu Shot  11/01/2023   Colon Cancer Screening  11/23/2024*   COVID-19 Vaccine (3 - 2025-26 season) 01/29/2026*   Medicare Annual Wellness Visit  12/15/2024   Breast Cancer Screening  02/18/2025   DTaP/Tdap/Td vaccine (3 - Td or Tdap) 11/07/2027   Pneumococcal Vaccine for age over 19  Completed   DEXA scan (bone density measurement)  Completed   Zoster (Shingles) Vaccine  Completed   Meningitis B Vaccine  Aged Out  *Topic was postponed. The date shown is not the original due date.    Mammogram and dexa ordered  No falls/fractures  Discussed fall prevention, supplements and exercise for bone density  Cologuard utd  PHQ 0        Prediabetes   Prediabetes  Lab Results  Component Value Date   HGBA1C 6.2 01/07/2024   HGBA1C 6.0 01/10/2023   HGBA1C 6.1 12/11/2021    disc imp of low glycemic diet and wt loss to prevent DM2        Mouth pain   Intermittent burning sensationin mouth  Lips get chapped  No abn on exam Has changed some products / toothpaste Encouraged to keep food diary  Follow up with dentist  Will call if worse or not improved        Hyperlipidemia   Disc goals for lipids and reasons to control them Rev last labs with pt Rev low sat fat diet in detail LDL up to 132 despite good diet  Will  change statin from zocor  40 to crestor 10  Instructed to call if any side effects  Re check 6 wk Encouraged to keep up good diet       Relevant Medications   rosuvastatin (CRESTOR) 10 MG tablet   Hyperkalemia   Lab Results  Component Value Date   K 5.2 (H) 01/07/2024   No reason  ?  Hemolysis  Re check today      Relevant Orders   Basic metabolic panel with GFR   Estrogen deficiency   Dexa ordered       Relevant Orders   DG Bone Density   Colon cancer screening   Cologuard neg 06/2023

## 2024-01-14 NOTE — Assessment & Plan Note (Signed)
 Cologuard neg 06/2023

## 2024-01-14 NOTE — Assessment & Plan Note (Signed)
 Mammo ordered Pt will call to schedule

## 2024-01-14 NOTE — Assessment & Plan Note (Signed)
 Intermittent burning sensationin mouth  Lips get chapped  No abn on exam Has changed some products / toothpaste Encouraged to keep food diary  Follow up with dentist  Will call if worse or not improved

## 2024-01-14 NOTE — Assessment & Plan Note (Signed)
 No falls or fractures  Dexa ordered Discussed fall prevention, supplements and exercise for bone density

## 2024-01-14 NOTE — Assessment & Plan Note (Signed)
 Reviewed health habits including diet and exercise and skin cancer prevention Reviewed appropriate screening tests for age  Also reviewed health mt list, fam hx and immunization status , as well as social and family history   See HPI Labs reviewed and ordered Health Maintenance  Topic Date Due   Hepatitis C Screening  Never done   Flu Shot  11/01/2023   Colon Cancer Screening  11/23/2024*   COVID-19 Vaccine (3 - 2025-26 season) 01/29/2026*   Medicare Annual Wellness Visit  12/15/2024   Breast Cancer Screening  02/18/2025   DTaP/Tdap/Td vaccine (3 - Td or Tdap) 11/07/2027   Pneumococcal Vaccine for age over 33  Completed   DEXA scan (bone density measurement)  Completed   Zoster (Shingles) Vaccine  Completed   Meningitis B Vaccine  Aged Out  *Topic was postponed. The date shown is not the original due date.    Mammogram and dexa ordered  No falls/fractures  Discussed fall prevention, supplements and exercise for bone density  Cologuard utd  PHQ 0

## 2024-01-14 NOTE — Assessment & Plan Note (Signed)
 Lab Results  Component Value Date   K 5.2 (H) 01/07/2024   No reason  ? Hemolysis  Re check today

## 2024-01-14 NOTE — Assessment & Plan Note (Signed)
 Disc goals for lipids and reasons to control them Rev last labs with pt Rev low sat fat diet in detail LDL up to 132 despite good diet  Will change statin from zocor  40 to crestor 10  Instructed to call if any side effects  Re check 6 wk Encouraged to keep up good diet

## 2024-02-24 ENCOUNTER — Telehealth: Payer: Self-pay | Admitting: *Deleted

## 2024-02-24 NOTE — Telephone Encounter (Signed)
 Lvm to schedule for fasting lab apt per Dr Randeen

## 2024-02-24 NOTE — Telephone Encounter (Signed)
 Copied from CRM #[id number]. Topic: Appointments - Appointment Cancel/Reschedule >> Feb 24, 2024  3:48 PM Rosina BIRCH wrote: Patient/patient representative is calling to cancel or reschedule an appointment. Refer to attachments for appointment information.   Patient called stating she wanted to make a lab appointment for cholesterol because the provider told her to but there is no labs ordered [phone]

## 2024-02-24 NOTE — Telephone Encounter (Signed)
 Per Dr. Graham last OV note:  Will change statin from zocor  40 to crestor  10  Instructed to call if any side effects  Re check 6 wk  **please schedule fasting lab appt now, pt is due for a lipid recheck, PCP will place orders closer to appt date**

## 2024-03-03 ENCOUNTER — Ambulatory Visit
Admission: RE | Admit: 2024-03-03 | Discharge: 2024-03-03 | Disposition: A | Source: Ambulatory Visit | Attending: Family Medicine | Admitting: Family Medicine

## 2024-03-03 DIAGNOSIS — E2839 Other primary ovarian failure: Secondary | ICD-10-CM | POA: Insufficient documentation

## 2024-03-03 DIAGNOSIS — Z1231 Encounter for screening mammogram for malignant neoplasm of breast: Secondary | ICD-10-CM | POA: Insufficient documentation

## 2024-03-04 ENCOUNTER — Other Ambulatory Visit: Payer: Self-pay | Admitting: Medical Genetics

## 2024-03-11 ENCOUNTER — Telehealth: Payer: Self-pay | Admitting: *Deleted

## 2024-03-11 DIAGNOSIS — E7800 Pure hypercholesterolemia, unspecified: Secondary | ICD-10-CM

## 2024-03-11 NOTE — Telephone Encounter (Signed)
 Provider mentioned is the prescriber of the [facility] testing not one of pt's providers.  Per Dr. Randeen at pt's CPE in Oct:  will change statin from zocor  40 to crestor  10  Instructed to call if any side effects  Re check 6 wk  **please schedule fasting lab appt to recheck lipid, PCP will place order closer to appt date**

## 2024-03-11 NOTE — Telephone Encounter (Signed)
 Copied from CRM #[id number]. Topic: Clinical - Request for Lab/Test Order >> Mar 11, 2024 12:12 PM Mesmerise C wrote: Reason for CRM: Patient is requesting a blood draw on Friday to get a cholesterol test done no orders in seen a order put in from Dr. Delinda on12/3 patient stated never heard of him should be from Dr. Randeen patient would like a call back from the her nurse at [phone]

## 2024-03-12 NOTE — Telephone Encounter (Signed)
 The orders are in  ? If there will be a lab appointment avail that day or not

## 2024-03-13 ENCOUNTER — Other Ambulatory Visit (INDEPENDENT_AMBULATORY_CARE_PROVIDER_SITE_OTHER)

## 2024-03-13 DIAGNOSIS — E7800 Pure hypercholesterolemia, unspecified: Secondary | ICD-10-CM

## 2024-03-13 LAB — LIPID PANEL
Cholesterol: 174 mg/dL (ref 0–200)
HDL: 74.4 mg/dL (ref >39.00)
LDL Cholesterol: 81 mg/dL (ref 0–99)
NonHDL: 99.1
Total CHOL/HDL Ratio: 2
Triglycerides: 90 mg/dL (ref 0.0–149.0)
VLDL: 18 mg/dL (ref 0.0–40.0)

## 2024-03-13 LAB — ALT: ALT: 24 U/L (ref 0–35)

## 2024-03-13 LAB — AST: AST: 27 U/L (ref 0–37)

## 2024-03-15 ENCOUNTER — Ambulatory Visit: Payer: Self-pay | Admitting: Family Medicine

## 2024-12-16 ENCOUNTER — Ambulatory Visit
